# Patient Record
Sex: Male | Born: 1990 | Race: White | Hispanic: No | Marital: Single | Smoking: Current every day smoker
Health system: Southern US, Community
[De-identification: ages and names within clinical notes are randomized; demographics above are authoritative.]

## PROBLEM LIST (undated history)

## (undated) HISTORY — PX: WISDOM TOOTH EXTRACTION: SHX21

---
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
## 2015-10-14 ENCOUNTER — Encounter: Payer: Self-pay | Admitting: Emergency Medicine

## 2015-10-14 ENCOUNTER — Emergency Department: Payer: Medicaid - Out of State

## 2015-10-14 DIAGNOSIS — J029 Acute pharyngitis, unspecified: Secondary | ICD-10-CM | POA: Insufficient documentation

## 2015-10-14 DIAGNOSIS — F17210 Nicotine dependence, cigarettes, uncomplicated: Secondary | ICD-10-CM | POA: Insufficient documentation

## 2015-10-14 DIAGNOSIS — R042 Hemoptysis: Secondary | ICD-10-CM | POA: Insufficient documentation

## 2015-10-14 DIAGNOSIS — R0602 Shortness of breath: Secondary | ICD-10-CM | POA: Diagnosis present

## 2015-10-14 LAB — CBC
HCT: 43.1 % (ref 40.0–52.0)
HEMOGLOBIN: 14.9 g/dL (ref 13.0–18.0)
MCH: 29.5 pg (ref 26.0–34.0)
MCHC: 34.6 g/dL (ref 32.0–36.0)
MCV: 85.4 fL (ref 80.0–100.0)
PLATELETS: 192 10*3/uL (ref 150–440)
RBC: 5.04 MIL/uL (ref 4.40–5.90)
RDW: 13.8 % (ref 11.5–14.5)
WBC: 13.7 10*3/uL — ABNORMAL HIGH (ref 3.8–10.6)

## 2015-10-14 LAB — BASIC METABOLIC PANEL
Anion gap: 7 (ref 5–15)
BUN: <5 mg/dL — ABNORMAL LOW (ref 6–20)
CALCIUM: 9.1 mg/dL (ref 8.9–10.3)
CO2: 28 mmol/L (ref 22–32)
CREATININE: 0.85 mg/dL (ref 0.61–1.24)
Chloride: 101 mmol/L (ref 101–111)
GFR calc Af Amer: >60 mL/min (ref >60)
GLUCOSE: 126 mg/dL — AB (ref 65–99)
Potassium: 3.4 mmol/L — ABNORMAL LOW (ref 3.5–5.1)
Sodium: 136 mmol/L (ref 135–145)

## 2015-10-14 LAB — TROPONIN I: Troponin I: <0.03 ng/mL (ref <0.03)

## 2015-10-14 NOTE — ED Notes (Signed)
Patient reports that he started with shortness of breath and coughing yesterday. Patient reports some streaks of bright red blood with coughing. Patient reports that today he developed central chest pressure. Patient reports that he has also started having difficulty swallowing that started today.

## 2015-10-15 ENCOUNTER — Emergency Department
Admission: EM | Admit: 2015-10-15 | Discharge: 2015-10-15 | Disposition: A | Discharge status: Home / Self Care | Payer: Medicaid - Out of State | Attending: Emergency Medicine | Admitting: Emergency Medicine

## 2015-10-15 MED ORDER — DEXAMETHASONE SODIUM PHOSPHATE 10 MG/ML IJ SOLN
INTRAMUSCULAR | Status: AC
  Filled 2015-10-15: qty 1

## 2015-10-15 MED ORDER — GI COCKTAIL ~~LOC~~
ORAL | Status: AC
  Filled 2015-10-15: qty 30

## 2019-05-27 ENCOUNTER — Other Ambulatory Visit: Payer: Self-pay

## 2019-05-27 ENCOUNTER — Ambulatory Visit: Payer: Medicaid - Out of State | Admitting: Podiatry

## 2019-05-27 DIAGNOSIS — L600 Ingrowing nail: Secondary | ICD-10-CM | POA: Diagnosis not present

## 2019-05-27 DIAGNOSIS — L603 Nail dystrophy: Secondary | ICD-10-CM

## 2019-05-28 ENCOUNTER — Encounter: Payer: Self-pay | Admitting: Podiatry

## 2019-05-28 NOTE — Progress Notes (Signed)
Subjective:  Patient ID: Hunter Kelly, male    DOB: 1991-02-23,  MRN: [id number]  Chief Complaint  Patient presents with  . Ingrown Toenail    bil ingrown toenails, (worse in the left) left big toenail lateral side, and right big toenail medial side, pt states that it has been going on for multiple years, pain is elevated when applying pressure.     29 y.o. male presents with the above complaint.  Patient presents with dystrophic toenail of the left as well as the right hallux bilaterally.  Patient states that they have gotten worse over time.  Patient has had previous toenail removed with removal at urgent care center which has caused him a lot of pain.  Patient states this has been going for 12 years.  He used to play basketball football that it caused him excessive pressure and has constantly hit it while playing sports.  He states that it is painful to touch.  The right side is bearable however the left side is very painful and he would like to know if there is anything that could be done for this.  I extensively discussed with the patient all treatment options that are associated with that including removing the entire toenail.  Patient is also very anxious and does not want his son that was present with him to see the blood.  Given that he has had plenty of negative experiences in the past with toenail of removal and the pain associated with it he is very hesitant to have it removed.   Review of Systems: Negative except as noted in the HPI. Denies N/V/F/Ch.  No past medical history on file.  Current Outpatient Medications:  .  Buprenorphine HCl-Naloxone HCl (SUBOXONE) 12-3 MG FILM, Place under the tongue., Disp: , Rfl:  .  hydrOXYzine (ATARAX/VISTARIL) 10 MG tablet, Take 10 mg by mouth 3 (three) times daily as needed., Disp: , Rfl:   Social History   Tobacco Use  Smoking Status Current Every Day Smoker  . Packs/day: 1.00  . Types: Cigarettes    No Known Allergies Objective:    There were no vitals filed for this visit. There is no height or weight on file to calculate BMI. Constitutional Well developed. Well nourished.  Vascular Dorsalis pedis pulses palpable bilaterally. Posterior tibial pulses palpable bilaterally. Capillary refill normal to all digits.  No cyanosis or clubbing noted. Pedal hair growth normal.  Neurologic Normal speech. Oriented to person, place, and time. Epicritic sensation to light touch grossly present bilaterally.  Dermatologic Pain on palpation of the entire/total nail on 1st digit of the bilaterally No other open wounds. No skin lesions.  Orthopedic: Normal joint ROM without pain or crepitus bilaterally. No visible deformities. No bony tenderness.   Radiographs: None Assessment:   1. Ingrown left big toenail   2. Ingrown toenail of right foot   3. Nail dystrophy    Plan:  Patient was evaluated and treated and all questions answered.  Nail contusion/dystrophy hallux, bilaterally left greater than right -I extensively discussed with the patient all the various procedures that are involved including and up to total nail avulsion with matricectomy.  Patient is very anxious and afraid to have the toenails removed at this time.  He is also here with his son and he does not want the patient son to see the blood when undergoing the procedure.  He would like to know if this could be done at a later time.  I agree with the plan  and patient will be scheduled next week on his day off for left hallux total nail avulsion.  If his right hallux becomes more symptomatic we will plan on removing that as well. -Given his history of traumatic injury to both of the hallux this is likely due to trauma and less likely due to fungus.  This was also very extensively discussed between differences between the 2.  I did explain to the patient the only way to evaluate and differentiate between the 2 is through biopsying the nail.  Even then this could come  back as both. -I spent greater than 50 minutes with greater than 50% face-to-face encounter with the patient extensively discussing various treatment options including performing culture of the nail biopsying the nail as well as discussing will be the best options for him given that he has had multiple removals done in the past and the nail keeps growing back in a very dystrophic way.  Given the amount of pain in the painful experiences is had in past is also very hesitant to have it removed because he is worried that this will cause him a lot of pain.  I did explain to the patient that I tend to use short acting and long-acting numbing agent to decrease the pain associated with it.  Patient states understanding.  We will plan on performing this during the next visit next week.  Return f/u next thursday, overbook if needed.

## 2019-06-03 ENCOUNTER — Other Ambulatory Visit: Payer: Self-pay

## 2019-06-03 ENCOUNTER — Ambulatory Visit: Payer: Medicaid - Out of State | Admitting: Podiatry

## 2019-06-03 DIAGNOSIS — B351 Tinea unguium: Secondary | ICD-10-CM | POA: Diagnosis not present

## 2019-06-03 DIAGNOSIS — L603 Nail dystrophy: Secondary | ICD-10-CM

## 2019-06-03 DIAGNOSIS — L600 Ingrowing nail: Secondary | ICD-10-CM | POA: Diagnosis not present

## 2019-06-03 MED ORDER — TERBINAFINE HCL 250 MG PO TABS: 250.0000 mg | ORAL_TABLET | Freq: Every day | ORAL | 0 refills | Status: DC

## 2019-06-03 MED ORDER — DOXYCYCLINE HYCLATE 100 MG PO TABS: 100.0000 mg | ORAL_TABLET | Freq: Two times a day (BID) | ORAL | 0 refills | Status: DC

## 2019-06-04 ENCOUNTER — Encounter: Payer: Self-pay | Admitting: Podiatry

## 2019-06-04 NOTE — Progress Notes (Signed)
Subjective:  Patient ID: Hunter Kelly, male    DOB: 08/21/1990,  MRN: [id number]  Chief Complaint  Patient presents with  . Ingrown Toenail    pt is here for an ingrown toenail f/u, pt states that the left big toenail is still in pain since the last time he was here, pt states that he also has his liver function test that needs to be reviewed.    29 y.o. male presents with the above complaint.  Patient is following up today for left big toenail total nail avulsion.  Given how dystrophic the nail [place] I believe patient will benefit from that with the sample of the nail being sent to pathology to be evaluated if this is traumatic versus fungal involvement.  Patient also has a liver function test that was done which was normal.  I will start him on Lamisil therapy.   Review of Systems: Negative except as noted in the HPI. Denies N/V/F/Ch.  No past medical history on file.  Current Outpatient Medications:  .  Buprenorphine HCl-Naloxone HCl (SUBOXONE) 12-3 MG FILM, Place under the tongue., Disp: , Rfl:  .  hydrOXYzine (ATARAX/VISTARIL) 10 MG tablet, Take 10 mg by mouth 3 (three) times daily as needed., Disp: , Rfl:  .  doxycycline (VIBRA-TABS) 100 MG tablet, Take 1 tablet (100 mg total) by mouth 2 (two) times daily., Disp: 20 tablet, Rfl: 0 .  terbinafine (LAMISIL) 250 MG tablet, Take 1 tablet (250 mg total) by mouth daily., Disp: 90 tablet, Rfl: 0  Social History   Tobacco Use  Smoking Status Current Every Day Smoker  . Packs/day: 1.00  . Types: Cigarettes    No Known Allergies Objective:  There were no vitals filed for this visit. There is no height or weight on file to calculate BMI. Constitutional Well developed. Well nourished.  Vascular Dorsalis pedis pulses palpable bilaterally. Posterior tibial pulses palpable bilaterally. Capillary refill normal to all digits.  No cyanosis or clubbing noted. Pedal hair growth normal.  Neurologic Normal speech. Oriented to person, place,  and time. Epicritic sensation to light touch grossly present bilaterally.  Dermatologic Pain on palpation of the entire/total nail on 1st digit of the bilaterally No other open wounds. No skin lesions.  Orthopedic: Normal joint ROM without pain or crepitus bilaterally. No visible deformities. No bony tenderness.   Radiographs: None Assessment:   1. Nail fungus   2. Nail dystrophy   3. Onychomycosis due to dermatophyte   4. Ingrown left big toenail   5. Ingrown toenail of right foot    Plan:  Patient was evaluated and treated and all questions answered.  Nail contusion/dystrophy left hallux, left -Patient elects to proceed with minor surgery to remove entire toenail today. Consent reviewed and signed by patient. -Entire/total nail excised. See procedure note. -Educated on post-procedure care including soaking. Written instructions provided and reviewed. -Patient to follow up in 2 weeks for nail check. -The nail was sent to be evaluated for pathology for involvement of fungus versus traumatic nature to it. -The labs were reviewed the patient brought in and will be scanned into the system.  The liver function test was normal and therefore I feel comfortable starting the patient on Lamisil. -I have asked him to complete the course for 3 months and I will see him back on the fourth month for evaluation of the new nail. -If there is any issues or any clinical signs of infection I have asked the patient to go to the emergency  room or come see me right away.  Patient states understanding -I have also placed him on doxycycline prophylactics for 10 days for skin and soft tissue prophylaxis -I will call him back once the results from pathologist is back and discussed the findings with him.   Procedure: Excision of entire/total nail  Location: Left 1st toe digit Anesthesia: Lidocaine 1% plain; 1.5 mL and Marcaine 0.5% plain; 1.5 mL, digital block. Skin Prep: Betadine. Dressing: Silvadene;  telfa; dry, sterile, compression dressing. Technique: Following skin prep, the toe was exsanguinated and a tourniquet was secured at the base of the toe. The affected nail border was freed and excised. The tourniquet was then removed and sterile dressing applied.  Phenol was applied to the medial and lateral corner of the nail in an attempt to make it permanent therefore no ingrown is present. Disposition: Patient tolerated procedure well. Patient to return in 2 weeks for follow-up.   No follow-ups on file.   No follow-ups on file.

## 2019-06-18 ENCOUNTER — Encounter (HOSPITAL_COMMUNITY): Payer: Self-pay | Admitting: Cardiology

## 2019-06-18 ENCOUNTER — Other Ambulatory Visit: Payer: Self-pay

## 2019-06-18 ENCOUNTER — Ambulatory Visit (INDEPENDENT_AMBULATORY_CARE_PROVIDER_SITE_OTHER): Payer: Medicaid Other | Admitting: Podiatry

## 2019-06-18 ENCOUNTER — Ambulatory Visit (INDEPENDENT_AMBULATORY_CARE_PROVIDER_SITE_OTHER): Payer: Medicaid Other

## 2019-06-18 DIAGNOSIS — L03116 Cellulitis of left lower limb: Secondary | ICD-10-CM | POA: Diagnosis not present

## 2019-06-18 MED ORDER — CLINDAMYCIN HCL 300 MG PO CAPS: 300.0000 mg | ORAL_CAPSULE | Freq: Three times a day (TID) | ORAL | 0 refills | Status: DC

## 2019-06-18 NOTE — Progress Notes (Signed)
Attempt made to contact Dr. Logan Bores with preliminary report for findings of negative bilateral LEV DVT study at 5:48 and answering service at 5:55PM.

## 2019-06-23 NOTE — Progress Notes (Signed)
   HPI: 29 y.o. male presenting today as a walk-in after hours.  Apparently there was some confusion whether to present to the [place] office or [place] office.  He presented to the [place] office after the office hours were closed.  We decided to add him onto the daily schedule for evaluation.  He presented in severe pain to his left foot.  Pain extends up to the calf.  He had total nail avulsion performed to the left hallux by Dr. Allena Katz on 05/27/2019.  He states that ever since the procedure he has had severe pain throughout the entire foot.  He is a smoker and smokes 1 pack/day.  No past medical history on file.   Physical Exam: General: The patient is alert and oriented x3 in no acute distress.  Dermatology: Nail avulsion left hallux.  Appears to be healing well.  There is no erythema or edema around the toe.  No drainage.  No malodor noted.  Vascular: Palpable pedal pulses bilaterally.  There is some mild edema of the ankle.  There is no erythema or edema to the nail avulsion site of the left hallux.  Capillary refill within normal limits.  Neurological: Epicritic and protective threshold grossly intact bilaterally.   Musculoskeletal Exam: Range of motion within normal limits to all pedal and ankle joints bilateral. Muscle strength 5/5 in all groups bilateral.  Severe pain on palpation noted to the general area of the foot extending up the ankle into the back of the leg   Assessment: 1.  Status post total nail avulsion left hallux with routine healing 2.  Severe pain left foot and posterior leg concerning for possible DVT 3.  Mild edema left ankle   Plan of Care:  1. Patient evaluated. 2.  Continue antibiotic cream and a Band-Aid to the left hallux 3.  Today we will order a stat venous Doppler left lower extremity to rule out DVT 4.  Prescription for doxycycline 100 mg 2 times daily #20 5.  Return to clinic in 1 week with Dr. Boyd Kerbs, DPM [facility]  Dr. Felecia Shelling, DPM    [address] [place], [place] [address]                Office [phone]  Fax [phone]

## 2020-03-07 ENCOUNTER — Other Ambulatory Visit: Payer: Self-pay

## 2020-03-07 ENCOUNTER — Emergency Department: Payer: Medicaid Other

## 2020-03-07 ENCOUNTER — Encounter: Payer: Self-pay | Admitting: Emergency Medicine

## 2020-03-07 ENCOUNTER — Inpatient Hospital Stay
Admission: EM | Admit: 2020-03-07 | Discharge: 2020-03-09 | DRG: 202 | Disposition: A | Discharge status: Home / Self Care | Payer: Medicaid Other | Attending: Internal Medicine | Admitting: Internal Medicine

## 2020-03-07 DIAGNOSIS — J9601 Acute respiratory failure with hypoxia: Secondary | ICD-10-CM | POA: Diagnosis present

## 2020-03-07 DIAGNOSIS — J9690 Respiratory failure, unspecified, unspecified whether with hypoxia or hypercapnia: Secondary | ICD-10-CM | POA: Diagnosis present

## 2020-03-07 DIAGNOSIS — F17210 Nicotine dependence, cigarettes, uncomplicated: Secondary | ICD-10-CM | POA: Diagnosis present

## 2020-03-07 DIAGNOSIS — Z825 Family history of asthma and other chronic lower respiratory diseases: Secondary | ICD-10-CM

## 2020-03-07 DIAGNOSIS — R739 Hyperglycemia, unspecified: Secondary | ICD-10-CM | POA: Diagnosis not present

## 2020-03-07 DIAGNOSIS — J40 Bronchitis, not specified as acute or chronic: Principal | ICD-10-CM | POA: Diagnosis present

## 2020-03-07 DIAGNOSIS — R0600 Dyspnea, unspecified: Secondary | ICD-10-CM

## 2020-03-07 DIAGNOSIS — Z20822 Contact with and (suspected) exposure to covid-19: Secondary | ICD-10-CM | POA: Diagnosis present

## 2020-03-07 DIAGNOSIS — R062 Wheezing: Secondary | ICD-10-CM

## 2020-03-07 DIAGNOSIS — F1111 Opioid abuse, in remission: Secondary | ICD-10-CM | POA: Diagnosis present

## 2020-03-07 DIAGNOSIS — T380X5A Adverse effect of glucocorticoids and synthetic analogues, initial encounter: Secondary | ICD-10-CM | POA: Diagnosis not present

## 2020-03-07 LAB — CBC WITH DIFFERENTIAL/PLATELET
Abs Immature Granulocytes: 0.03 10*3/uL (ref 0.00–0.07)
Basophils Absolute: 0.1 10*3/uL (ref 0.0–0.1)
Basophils Relative: 1 %
Eosinophils Absolute: 0.9 10*3/uL — ABNORMAL HIGH (ref 0.0–0.5)
Eosinophils Relative: 8 %
HCT: 47.9 % (ref 39.0–52.0)
Hemoglobin: 15.9 g/dL (ref 13.0–17.0)
Immature Granulocytes: 0 %
Lymphocytes Relative: 30 %
Lymphs Abs: 3.2 10*3/uL (ref 0.7–4.0)
MCH: 29.1 pg (ref 26.0–34.0)
MCHC: 33.2 g/dL (ref 30.0–36.0)
MCV: 87.6 fL (ref 80.0–100.0)
Monocytes Absolute: 0.7 10*3/uL (ref 0.1–1.0)
Monocytes Relative: 6 %
Neutro Abs: 5.8 10*3/uL (ref 1.7–7.7)
Neutrophils Relative %: 55 %
Platelets: 309 10*3/uL (ref 150–400)
RBC: 5.47 MIL/uL (ref 4.22–5.81)
RDW: 13.2 % (ref 11.5–15.5)
WBC: 10.7 10*3/uL — ABNORMAL HIGH (ref 4.0–10.5)
nRBC: 0 % (ref 0.0–0.2)

## 2020-03-07 LAB — BASIC METABOLIC PANEL
Anion gap: 10 (ref 5–15)
BUN: 5 mg/dL — ABNORMAL LOW (ref 6–20)
CO2: 27 mmol/L (ref 22–32)
Calcium: 9.2 mg/dL (ref 8.9–10.3)
Chloride: 102 mmol/L (ref 98–111)
Creatinine, Ser: 0.66 mg/dL (ref 0.61–1.24)
GFR, Estimated: >60 mL/min (ref >60)
Glucose, Bld: 103 mg/dL — ABNORMAL HIGH (ref 70–99)
Potassium: 4 mmol/L (ref 3.5–5.1)
Sodium: 139 mmol/L (ref 135–145)

## 2020-03-07 LAB — RESP PANEL BY RT-PCR (FLU A&B, COVID) ARPGX2
Influenza A by PCR: NEGATIVE
Influenza B by PCR: NEGATIVE
SARS Coronavirus 2 by RT PCR: NEGATIVE

## 2020-03-07 LAB — FIBRIN DERIVATIVES D-DIMER (ARMC ONLY): Fibrin derivatives D-dimer (ARMC): 173.92 ng/mL (FEU) (ref 0.00–499.00)

## 2020-03-07 MED ORDER — ENOXAPARIN SODIUM 40 MG/0.4ML ~~LOC~~ SOLN
40.0000 mg | SUBCUTANEOUS | Status: DC
  Administered 2020-03-08: 40 mg via SUBCUTANEOUS
  Filled 2020-03-07 (×3): qty 0.4

## 2020-03-07 MED ORDER — ONDANSETRON HCL 4 MG/2ML IJ SOLN
4.0000 mg | Freq: Four times a day (QID) | INTRAMUSCULAR | Status: DC | PRN
  Administered 2020-03-08: 06:00:00 4 mg via INTRAVENOUS
  Filled 2020-03-07: qty 2

## 2020-03-07 MED ORDER — ACETAMINOPHEN 650 MG RE SUPP: 650.0000 mg | Freq: Four times a day (QID) | RECTAL | Status: DC | PRN

## 2020-03-07 MED ORDER — IPRATROPIUM-ALBUTEROL 0.5-2.5 (3) MG/3ML IN SOLN
3.0000 mL | Freq: Once | RESPIRATORY_TRACT | Status: AC
  Administered 2020-03-07: 3 mL via RESPIRATORY_TRACT
  Filled 2020-03-07: qty 3

## 2020-03-07 MED ORDER — ONDANSETRON HCL 4 MG PO TABS: 4.0000 mg | ORAL_TABLET | Freq: Four times a day (QID) | ORAL | Status: DC | PRN

## 2020-03-07 MED ORDER — ACETAMINOPHEN 325 MG PO TABS
650.0000 mg | ORAL_TABLET | Freq: Four times a day (QID) | ORAL | Status: DC | PRN
  Administered 2020-03-08: 650 mg via ORAL
  Filled 2020-03-07: qty 2

## 2020-03-07 MED ORDER — GUAIFENESIN ER 600 MG PO TB12
600.0000 mg | ORAL_TABLET | Freq: Two times a day (BID) | ORAL | Status: DC
  Administered 2020-03-07 – 2020-03-09 (×4): 600 mg via ORAL
  Filled 2020-03-07 (×4): qty 1

## 2020-03-07 MED ORDER — METHYLPREDNISOLONE SODIUM SUCC 125 MG IJ SOLR
125.0000 mg | INTRAMUSCULAR | Status: AC
  Administered 2020-03-07: 125 mg via INTRAVENOUS
  Filled 2020-03-07: qty 2

## 2020-03-07 MED ORDER — ALBUTEROL SULFATE (2.5 MG/3ML) 0.083% IN NEBU
2.5000 mg | INHALATION_SOLUTION | RESPIRATORY_TRACT | Status: DC | PRN
  Administered 2020-03-07 – 2020-03-08 (×2): 2.5 mg via RESPIRATORY_TRACT
  Filled 2020-03-07 (×2): qty 3

## 2020-03-07 MED ORDER — ALBUTEROL SULFATE (2.5 MG/3ML) 0.083% IN NEBU
5.0000 mg | INHALATION_SOLUTION | Freq: Once | RESPIRATORY_TRACT | Status: AC
  Administered 2020-03-07: 5 mg via RESPIRATORY_TRACT
  Filled 2020-03-07: qty 6

## 2020-03-07 MED ORDER — METHYLPREDNISOLONE SODIUM SUCC 40 MG IJ SOLR
40.0000 mg | Freq: Four times a day (QID) | INTRAMUSCULAR | Status: DC
  Administered 2020-03-07 – 2020-03-09 (×7): 40 mg via INTRAVENOUS
  Filled 2020-03-07 (×8): qty 1

## 2020-03-07 MED ORDER — IPRATROPIUM-ALBUTEROL 0.5-2.5 (3) MG/3ML IN SOLN
3.0000 mL | Freq: Four times a day (QID) | RESPIRATORY_TRACT | Status: DC
  Administered 2020-03-07 – 2020-03-09 (×8): 3 mL via RESPIRATORY_TRACT
  Filled 2020-03-07 (×7): qty 3

## 2020-03-07 MED ORDER — SODIUM CHLORIDE 0.9 % IV SOLN: INTRAVENOUS | Status: DC

## 2020-03-07 MED ORDER — NICOTINE 21 MG/24HR TD PT24
21.0000 mg | MEDICATED_PATCH | Freq: Every day | TRANSDERMAL | Status: DC
  Administered 2020-03-07 – 2020-03-09 (×3): 21 mg via TRANSDERMAL
  Filled 2020-03-07 (×3): qty 1

## 2020-03-07 MED ORDER — MAGNESIUM SULFATE 2 GM/50ML IV SOLN
2.0000 g | INTRAVENOUS | Status: AC
  Administered 2020-03-07: 2 g via INTRAVENOUS
  Filled 2020-03-07: qty 50

## 2020-03-07 NOTE — H&P (Signed)
History and Physical   Hunter Kelly OZD:[id number] DOB: 11-16-1990 DOA: 03/07/2020  Referring MD/NP/PA: Dr. Scotty Court  [facility]P: [facility]   Outpatient Specialists: None  Patient coming from: Home  Chief Complaint: Shortness of breath  HPI: Hunter Kelly is a 29 y.o. male with medical history significant of no significant past medical history history who apparently had acute viral illness about 2 months ago.  At the time patient thought he may have had COVID-19 infection.  He went to see his primary care physician and requested for a test to evaluate.  He was told that he had asthma or COPD with exacerbation.  He was not tested and patient has been feeling bad.  Per patient he never got better since then he has been continuously having cough shortness of breath.  He was worried about post Covid syndrome and blood clots.  He has some nebulizer at home with albuterol at home that he has been using.  Patient came to the ER where he was evaluated.  No evidence of acute PE.  He seemed to have some mild expiratory wheezing but also has hypoxemia.  His oxygen saturation on room air was 86% currently started on 2 L of oxygen.  He is being admitted with acute hypoxemia probably secondary to acute respiratory failure from acute asthma or COPD.Marland Kitchen  ED Course: Temperature 98 blood pressure 113/70 pulse 170 respirate 20 oxygen sat 86% on room air.  White count 10.7 hemoglobin 14.6 platelets 281.  Sodium 139 potassium 4.0 chloride 102 CO2 27 glucose 103 BUN 5 creatinine 0.66.  D-dimer was 73 glucose 103.  Respiratory panel including COVID-19 negative.  Chest x-ray showed no acute findings.  Patient being admitted for observation and treatment of acute hypoxic respiratory failure.  Review of Systems: As per HPI otherwise 10 point review of systems negative.    History reviewed. No pertinent past medical history.  Past Surgical History:  Procedure Laterality Date  . WISDOM TOOTH EXTRACTION        reports that he has been smoking cigarettes. He has been smoking about 1.00 pack per day. He has never used smokeless tobacco. He reports that he does not drink alcohol and does not use drugs.  No Known Allergies  No family history on file.   Prior to Admission medications   Medication Sig Start Date End Date Taking? Authorizing Provider  Buprenorphine HCl-Naloxone HCl (SUBOXONE) 12-3 MG FILM Place under the tongue.    [provider]  clindamycin (CLEOCIN) 300 MG capsule Take 1 capsule (300 mg total) by mouth 3 (three) times daily. 06/18/19   Felecia Shelling, DPM  doxycycline (VIBRA-TABS) 100 MG tablet Take 1 tablet (100 mg total) by mouth 2 (two) times daily. 06/03/19   Candelaria Stagers, DPM  hydrOXYzine (ATARAX/VISTARIL) 10 MG tablet Take 10 mg by mouth 3 (three) times daily as needed.    [provider]  terbinafine (LAMISIL) 250 MG tablet Take 1 tablet (250 mg total) by mouth daily. 06/03/19   Candelaria Stagers, DPM    Physical Exam: Vitals:   03/07/20 1323 03/07/20 1324 03/07/20 1603 03/07/20 1854  BP:  122/81 118/80   Pulse:  (!) 117 80   Resp:  20 20   Temp:  98 F (36.7 C)    TempSrc:  Oral    SpO2:  97% 95% (!) 86%  Weight: 46.3 kg     Height: 5\' 7"  (1.702 m)  Constitutional: Acutely ill looking, no distress Vitals:   03/07/20 1323 03/07/20 1324 03/07/20 1603 03/07/20 1854  BP:  122/81 118/80   Pulse:  (!) 117 80   Resp:  20 20   Temp:  98 F (36.7 C)    TempSrc:  Oral    SpO2:  97% 95% (!) 86%  Weight: 46.3 kg     Height: 5\' 7"  (1.702 m)      Eyes: PERRL, lids and conjunctivae normal ENMT: Mucous membranes are moist. Posterior pharynx clear of any exudate or lesions.Normal dentition.  Neck: normal, supple, no masses, no thyromegaly Respiratory: Decreased air entry bilaterally with mild expiratory wheeze, normal respiratory effort. No accessory muscle use.  Cardiovascular: Sinus tachycardia, no murmurs / rubs / gallops. No extremity  edema. 2+ pedal pulses. No carotid bruits.  Abdomen: no tenderness, no masses palpated. No hepatosplenomegaly. Bowel sounds positive.  Musculoskeletal: no clubbing / cyanosis. No joint deformity upper and lower extremities. Good ROM, no contractures. Normal muscle tone.  Skin: no rashes, lesions, ulcers. No induration Neurologic: CN 2-12 grossly intact. Sensation intact, DTR normal. Strength 5/5 in all 4.  Psychiatric: Normal judgment and insight. Alert and oriented x 3. Normal mood.     Labs on Admission: I have personally reviewed following labs and imaging studies  CBC: Recent Labs  Lab 03/07/20 1638  WBC 10.7*  NEUTROABS 5.8  HGB 15.9  HCT 47.9  MCV 87.6  PLT 309   Basic Metabolic Panel: Recent Labs  Lab 03/07/20 1638  NA 139  K 4.0  CL 102  CO2 27  GLUCOSE 103*  BUN 5*  CREATININE 0.66  CALCIUM 9.2   GFR: Estimated Creatinine Clearance: 90.8 mL/min (by C-G formula based on SCr of 0.66 mg/dL). Liver Function Tests: No results for input(s): AST, ALT, ALKPHOS, BILITOT, PROT, ALBUMIN in the last 168 hours. No results for input(s): LIPASE, AMYLASE in the last 168 hours. No results for input(s): AMMONIA in the last 168 hours. Coagulation Profile: No results for input(s): INR, PROTIME in the last 168 hours. Cardiac Enzymes: No results for input(s): CKTOTAL, CKMB, CKMBINDEX, TROPONINI in the last 168 hours. BNP (last 3 results) No results for input(s): PROBNP in the last 8760 hours. HbA1C: No results for input(s): HGBA1C in the last 72 hours. CBG: No results for input(s): GLUCAP in the last 168 hours. Lipid Profile: No results for input(s): CHOL, HDL, LDLCALC, TRIG, CHOLHDL, LDLDIRECT in the last 72 hours. Thyroid Function Tests: No results for input(s): TSH, T4TOTAL, FREET4, T3FREE, THYROIDAB in the last 72 hours. Anemia Panel: No results for input(s): VITAMINB12, FOLATE, FERRITIN, TIBC, IRON, RETICCTPCT in the last 72 hours. Urine analysis: No results found  for: COLORURINE, APPEARANCEUR, LABSPEC, PHURINE, GLUCOSEU, HGBUR, BILIRUBINUR, KETONESUR, PROTEINUR, UROBILINOGEN, NITRITE, LEUKOCYTESUR Sepsis Labs: @LABRCNTIP (procalcitonin:4,lacticidven:4) ) Recent Results (from the past 240 hour(s))  Resp Panel by RT-PCR (Flu A&B, Covid) Nasopharyngeal Swab     Status: None   Collection Time: 03/07/20  4:38 PM   Specimen: Nasopharyngeal Swab; Nasopharyngeal(NP) swabs in vial transport medium  Result Value Ref Range Status   SARS Coronavirus 2 by RT PCR NEGATIVE NEGATIVE Final    Comment: (NOTE) SARS-CoV-2 target nucleic acids are NOT DETECTED.  The SARS-CoV-2 RNA is generally detectable in upper respiratory specimens during the acute phase of infection. The lowest concentration of SARS-CoV-2 viral copies this assay can detect is 138 copies/mL. A negative result does not preclude SARS-Cov-2 infection and should not be used as the sole basis for treatment or  other patient management decisions. A negative result may occur with  improper specimen collection/handling, submission of specimen other than nasopharyngeal swab, presence of viral mutation(s) within the areas targeted by this assay, and inadequate number of viral copies(<138 copies/mL). A negative result must be combined with clinical observations, patient history, and epidemiological information. The expected result is Negative.  Fact Sheet for Patients:  BloggerCourse.com  Fact Sheet for Healthcare Providers:  SeriousBroker.it  This test is no t yet approved or cleared by the [place] FDA and  has been authorized for detection and/or diagnosis of SARS-CoV-2 by FDA under an Emergency Use Authorization (EUA). This EUA will remain  in effect (meaning this test can be used) for the duration of the COVID-19 declaration under Section 564(b)(1) of the Act, 21 U.S.C.section 360bbb-3(b)(1), unless the authorization is terminated  or revoked  sooner.       Influenza A by PCR NEGATIVE NEGATIVE Final   Influenza B by PCR NEGATIVE NEGATIVE Final    Comment: (NOTE) The Xpert Xpress SARS-CoV-2/FLU/RSV plus assay is intended as an aid in the diagnosis of influenza from Nasopharyngeal swab specimens and should not be used as a sole basis for treatment. Nasal washings and aspirates are unacceptable for Xpert Xpress SARS-CoV-2/FLU/RSV testing.  Fact Sheet for Patients: BloggerCourse.com  Fact Sheet for Healthcare Providers: SeriousBroker.it  This test is not yet approved or cleared by the [place] FDA and has been authorized for detection and/or diagnosis of SARS-CoV-2 by FDA under an Emergency Use Authorization (EUA). This EUA will remain in effect (meaning this test can be used) for the duration of the COVID-19 declaration under Section 564(b)(1) of the Act, 21 U.S.C. section 360bbb-3(b)(1), unless the authorization is terminated or revoked.  Performed at [facility], [address] [place], [place] [address]      Radiological Exams on Admission: DG Chest 2 View  Result Date: 03/07/2020 CLINICAL DATA:  Cough and shortness of breath EXAM: CHEST - 2 VIEW COMPARISON:  10/15/2015 FINDINGS: The heart size and mediastinal contours are within normal limits. Both lungs are clear. The visualized skeletal structures are unremarkable. IMPRESSION: No active cardiopulmonary disease. Electronically Signed   By: Signa Kell M.D.   On: 03/07/2020 14:06    EKG: Independently reviewed.  Sinus tachycardia otherwise no significant findings.  Assessment/Plan Active Problems:   Acute respiratory failure with hypoxia (HCC)     #1 acute respiratory failure with hypoxia: Suspected asthma or COPD.  Patient's mother has asthma but patient has never been diagnosed.  He has not been diagnosed formally for COPD as well.  We will admit the patient and initiate steroid nebulizer  with antibiotics.  We will consider long-term therapy and home oxygen.  #2 tobacco abuse: Nicotine patch with tobacco cessation counseling.   DVT prophylaxis: Lovenox Code Status: Full code Family Communication: No family at bedside Disposition Plan: Home Consults called: None Admission status: Observation  Severity of Illness: The appropriate patient status for this patient is OBSERVATION. Observation status is judged to be reasonable and necessary in order to provide the required intensity of service to ensure the patient's safety. The patient's presenting symptoms, physical exam findings, and initial radiographic and laboratory data in the context of their medical condition is felt to place them at decreased risk for further clinical deterioration. Furthermore, it is anticipated that the patient will be medically stable for discharge from the hospital within 2 midnights of admission. The following factors support the patient status of observation.   "  The patient's presenting symptoms include shortness of breath and cough. " The physical exam findings include mild expiratory wheezing. " The initial radiographic and laboratory data are no acute findings infection.     Lonia Blood MD [facility] Pager [phone]  If 7PM-7AM, please contact night-coverage [email or web address] Password [facility]  03/07/2020, 7:41 PM

## 2020-03-07 NOTE — ED Notes (Signed)
Patient is sleeping and is 86% on room air while sleeping. Dr. Scotty Court is aware.

## 2020-03-07 NOTE — ED Triage Notes (Addendum)
Arrives from PCP for ED evaluation .  Patient has a cough x 2 months that is not improving.  STates he believes symptoms started when he had COvid 2 months ago, but he was not tested by PCP, rather told he had new onset asthma and COPD.  Patient states he is unable to catch his breath.  Patient states o2 sats 82% on RA at PCP.    Strong cough in triage.

## 2020-03-07 NOTE — ED Provider Notes (Signed)
[facility] Emergency Department Provider Note  ____________________________________________  Time seen: Approximately 7:07 PM  I have reviewed the triage vital signs and the nursing notes.   HISTORY  Chief Complaint Cough    HPI Hunter Kelly is a 29 y.o. male with no significant past medical history who comes ED complaining of cough for the past 2 months associated with shortness of breath and wheezing, worse at night, requiring frequent use of bronchodilators with a home nebulizer and albuterol pump inhaler.  He has seen primary care who started him on the bronchodilators.  He is not taking any inhaled corticosteroids or leukotriene inhibitor.  Denies chest pain.  No recent travel trauma hospitalization surgery or history of DVT or PE.  Patient does report the symptoms started after having a viral respiratory illness about 2 months ago.  He was not tested for Covid at that time but believes that he had Covid and wonders if that may have precipitated new lung disease.  He is also a daily smoker.      History reviewed. No pertinent past medical history.   There are no problems to display for this patient.    Past Surgical History:  Procedure Laterality Date  . WISDOM TOOTH EXTRACTION       Prior to Admission medications   Medication Sig Start Date End Date Taking? Authorizing Provider  Buprenorphine HCl-Naloxone HCl (SUBOXONE) 12-3 MG FILM Place under the tongue.    [provider]  clindamycin (CLEOCIN) 300 MG capsule Take 1 capsule (300 mg total) by mouth 3 (three) times daily. 06/18/19   Felecia Shelling, DPM  doxycycline (VIBRA-TABS) 100 MG tablet Take 1 tablet (100 mg total) by mouth 2 (two) times daily. 06/03/19   Candelaria Stagers, DPM  hydrOXYzine (ATARAX/VISTARIL) 10 MG tablet Take 10 mg by mouth 3 (three) times daily as needed.    [provider]  terbinafine (LAMISIL) 250 MG tablet Take 1 tablet (250 mg total) by mouth daily.  06/03/19   Candelaria Stagers, DPM     Allergies Patient has no known allergies.   No family history on file.  Social History Social History   Tobacco Use  . Smoking status: Current Every Day Smoker    Packs/day: 1.00    Types: Cigarettes  . Smokeless tobacco: Never Used  Substance Use Topics  . Alcohol use: No  . Drug use: No    Review of Systems  Constitutional:   No fever or chills.  ENT:   No sore throat. No rhinorrhea. Cardiovascular:   No chest pain or syncope. Respiratory:   Positive shortness of breath and nonproductive cough. Gastrointestinal:   Negative for abdominal pain, vomiting and diarrhea.  Musculoskeletal:   Negative for focal pain or swelling All other systems reviewed and are negative except as documented above in ROS and HPI.  ____________________________________________   PHYSICAL EXAM:  VITAL SIGNS: ED Triage Vitals  Enc Vitals Group     BP 03/07/20 1324 122/81     Pulse Rate 03/07/20 1324 (!) 117     Resp 03/07/20 1324 20     Temp 03/07/20 1324 98 F (36.7 C)     Temp Source 03/07/20 1324 Oral     SpO2 03/07/20 1324 97 %     Weight 03/07/20 1323 102 lb 1.2 oz (46.3 kg)     Height 03/07/20 1323 5\' 7"  (1.702 m)     Head Circumference --      Peak Flow --  Pain Score 03/07/20 1323 0     Pain Loc --      Pain Edu? --      Excl. in GC? --     Vital signs reviewed, nursing assessments reviewed.   Constitutional:   Alert and oriented. Non-toxic appearance. Eyes:   Conjunctivae are normal. EOMI. PERRL. ENT      Head:   Normocephalic and atraumatic.      Nose:   Wearing a mask.      Mouth/Throat:   Wearing a mask.      Neck:   No meningismus. Full ROM. Hematological/Lymphatic/Immunilogical:   No cervical lymphadenopathy. Cardiovascular:   RRR. Symmetric bilateral radial and DP pulses.  No murmurs. Cap refill less than 2 seconds. Respiratory:   Oxygen saturation 87% on room air.  Normal respiratory effort without tachypnea/retractions.   Diffuse expiratory wheezing and prolonged expiratory phase.  No focal crackles.  Symmetric air movement in all lung fields.. Gastrointestinal:   Soft and nontender. Non distended. There is no CVA tenderness.  No rebound, rigidity, or guarding. Musculoskeletal:   Normal range of motion in all extremities. No joint effusions.  No lower extremity tenderness.  No edema. Neurologic:   Normal speech and language.  Motor grossly intact. No acute focal neurologic deficits are appreciated.  Skin:    Skin is warm, dry and intact. No rash noted.  No petechiae, purpura, or bullae.  ____________________________________________    LABS (pertinent positives/negatives) (all labs ordered are listed, but only abnormal results are displayed) Labs Reviewed  CBC WITH DIFFERENTIAL/PLATELET - Abnormal; Notable for the following components:      Result Value   WBC 10.7 (*)    Eosinophils Absolute 0.9 (*)    All other components within normal limits  BASIC METABOLIC PANEL - Abnormal; Notable for the following components:   Glucose, Bld 103 (*)    BUN 5 (*)    All other components within normal limits  RESP PANEL BY RT-PCR (FLU A&B, COVID) ARPGX2  FIBRIN DERIVATIVES D-DIMER ([facility] ONLY)   ____________________________________________   EKG  Interpreted by me Normal sinus rhythm rate of 94, axis and intervals.  Normal QRS ST segments and T waves.  ____________________________________________    RADIOLOGY  DG Chest 2 View  Result Date: 03/07/2020 CLINICAL DATA:  Cough and shortness of breath EXAM: CHEST - 2 VIEW COMPARISON:  10/15/2015 FINDINGS: The heart size and mediastinal contours are within normal limits. Both lungs are clear. The visualized skeletal structures are unremarkable. IMPRESSION: No active cardiopulmonary disease. Electronically Signed   By: Signa Kell M.D.   On: 03/07/2020 14:06     ____________________________________________   PROCEDURES Procedures  ____________________________________________  DIFFERENTIAL DIAGNOSIS   Reactive airway disease, pneumonia, pneumothorax, pulmonary embolism  CLINICAL IMPRESSION / ASSESSMENT AND PLAN / ED COURSE  Medications ordered in the ED: Medications  methylPREDNISolone sodium succinate (SOLU-MEDROL) 125 mg/2 mL injection 125 mg (125 mg Intravenous Given 03/07/20 1647)  magnesium sulfate IVPB 2 g 50 mL (0 g Intravenous Stopped 03/07/20 1816)  ipratropium-albuterol (DUONEB) 0.5-2.5 (3) MG/3ML nebulizer solution 3 mL (3 mLs Nebulization Given 03/07/20 1651)  albuterol (PROVENTIL) (2.5 MG/3ML) 0.083% nebulizer solution 5 mg (5 mg Nebulization Given 03/07/20 1651)  ipratropium-albuterol (DUONEB) 0.5-2.5 (3) MG/3ML nebulizer solution 3 mL (3 mLs Nebulization Given 03/07/20 1817)    Pertinent labs & imaging results that were available during my care of the patient were reviewed by me and considered in my medical decision making (see chart for details).  Hunter Kelly was evaluated in Emergency Department on 03/07/2020 for the symptoms described in the history of present illness. He was evaluated in the context of the global COVID-19 pandemic, which necessitated consideration that the patient might be at risk for infection with the SARS-CoV-2 virus that causes COVID-19. Institutional protocols and algorithms that pertain to the evaluation of patients at risk for COVID-19 are in a state of rapid change based on information released by regulatory bodies including the CDC and federal and state organizations. These policies and algorithms were followed during the patient's care in the ED.   Patient presents with persistent shortness of breath and cough, clinically wheezing consistent with a new onset of asthma or reactive airway disease possibly instigated by recent viral illness.  Labs today are reassuring with a normal D-dimer.   Covid and flu test is negative, chest x-ray image reviewed by me which is unremarkable without pneumothorax pneumonia or pleural effusion, radiology report agrees.  Patient given duo nebs and albuterol, Solu-Medrol and IV magnesium without relief of symptoms.  Still very wheezy.  Still hypoxic on room air to 86%.  Case discussed with hospitalist for further management.  No signs of infection.  Patient is not septic.      ____________________________________________   FINAL CLINICAL IMPRESSION(S) / ED DIAGNOSES    Final diagnoses:  Acute respiratory failure with hypoxia (HCC)  Wheezing without diagnosis of asthma     ED Discharge Orders    None      Portions of this note were generated with dragon dictation software. Dictation errors may occur despite best attempts at proofreading.   Sharman Cheek, MD 03/07/20 [phone]

## 2020-03-08 ENCOUNTER — Inpatient Hospital Stay: Payer: Medicaid Other

## 2020-03-08 DIAGNOSIS — J40 Bronchitis, not specified as acute or chronic: Secondary | ICD-10-CM | POA: Diagnosis not present

## 2020-03-08 DIAGNOSIS — R739 Hyperglycemia, unspecified: Secondary | ICD-10-CM | POA: Diagnosis not present

## 2020-03-08 DIAGNOSIS — J9601 Acute respiratory failure with hypoxia: Secondary | ICD-10-CM

## 2020-03-08 DIAGNOSIS — T380X5A Adverse effect of glucocorticoids and synthetic analogues, initial encounter: Secondary | ICD-10-CM | POA: Diagnosis not present

## 2020-03-08 DIAGNOSIS — Z825 Family history of asthma and other chronic lower respiratory diseases: Secondary | ICD-10-CM | POA: Diagnosis not present

## 2020-03-08 DIAGNOSIS — Z720 Tobacco use: Secondary | ICD-10-CM | POA: Diagnosis not present

## 2020-03-08 DIAGNOSIS — Z20822 Contact with and (suspected) exposure to covid-19: Secondary | ICD-10-CM | POA: Diagnosis present

## 2020-03-08 DIAGNOSIS — F1111 Opioid abuse, in remission: Secondary | ICD-10-CM | POA: Diagnosis present

## 2020-03-08 DIAGNOSIS — J9690 Respiratory failure, unspecified, unspecified whether with hypoxia or hypercapnia: Secondary | ICD-10-CM | POA: Diagnosis present

## 2020-03-08 DIAGNOSIS — R062 Wheezing: Secondary | ICD-10-CM | POA: Diagnosis present

## 2020-03-08 DIAGNOSIS — F17210 Nicotine dependence, cigarettes, uncomplicated: Secondary | ICD-10-CM | POA: Diagnosis present

## 2020-03-08 LAB — CBC
HCT: 43.8 % (ref 39.0–52.0)
HCT: 44.4 % (ref 39.0–52.0)
Hemoglobin: 14.6 g/dL (ref 13.0–17.0)
Hemoglobin: 15 g/dL (ref 13.0–17.0)
MCH: 28.5 pg (ref 26.0–34.0)
MCH: 29.5 pg (ref 26.0–34.0)
MCHC: 32.9 g/dL (ref 30.0–36.0)
MCHC: 34.2 g/dL (ref 30.0–36.0)
MCV: 86.2 fL (ref 80.0–100.0)
MCV: 86.7 fL (ref 80.0–100.0)
Platelets: 281 10*3/uL (ref 150–400)
Platelets: 290 10*3/uL (ref 150–400)
RBC: 5.08 MIL/uL (ref 4.22–5.81)
RBC: 5.12 MIL/uL (ref 4.22–5.81)
RDW: 13 % (ref 11.5–15.5)
RDW: 13 % (ref 11.5–15.5)
WBC: 10.7 10*3/uL — ABNORMAL HIGH (ref 4.0–10.5)
WBC: 9.6 10*3/uL (ref 4.0–10.5)
nRBC: 0 % (ref 0.0–0.2)
nRBC: 0 % (ref 0.0–0.2)

## 2020-03-08 LAB — COMPREHENSIVE METABOLIC PANEL
ALT: 10 U/L (ref 0–44)
AST: 22 U/L (ref 15–41)
Albumin: 4.2 g/dL (ref 3.5–5.0)
Alkaline Phosphatase: 70 U/L (ref 38–126)
Anion gap: 9 (ref 5–15)
BUN: 6 mg/dL (ref 6–20)
CO2: 24 mmol/L (ref 22–32)
Calcium: 9.1 mg/dL (ref 8.9–10.3)
Chloride: 103 mmol/L (ref 98–111)
Creatinine, Ser: 0.78 mg/dL (ref 0.61–1.24)
GFR, Estimated: >60 mL/min (ref >60)
Glucose, Bld: 208 mg/dL — ABNORMAL HIGH (ref 70–99)
Potassium: 3.8 mmol/L (ref 3.5–5.1)
Sodium: 136 mmol/L (ref 135–145)
Total Bilirubin: 0.6 mg/dL (ref 0.3–1.2)
Total Protein: 7.1 g/dL (ref 6.5–8.1)

## 2020-03-08 LAB — HEMOGLOBIN A1C
Hgb A1c MFr Bld: 5.7 % — ABNORMAL HIGH (ref 4.8–5.6)
Mean Plasma Glucose: 116.89 mg/dL

## 2020-03-08 LAB — CREATININE, SERUM
Creatinine, Ser: 0.75 mg/dL (ref 0.61–1.24)
GFR, Estimated: >60 mL/min (ref >60)

## 2020-03-08 LAB — HIV ANTIBODY (ROUTINE TESTING W REFLEX): HIV Screen 4th Generation wRfx: NONREACTIVE

## 2020-03-08 MED ORDER — BUPRENORPHINE HCL-NALOXONE HCL 2-0.5 MG SL SUBL
2.0000 | SUBLINGUAL_TABLET | Freq: Three times a day (TID) | SUBLINGUAL | Status: DC
  Administered 2020-03-08: 16:00:00 2 via SUBLINGUAL

## 2020-03-08 MED ORDER — HYDROCOD POLST-CPM POLST ER 10-8 MG/5ML PO SUER: 5.0000 mL | Freq: Two times a day (BID) | ORAL | Status: DC | PRN

## 2020-03-08 MED ORDER — METOPROLOL TARTRATE 5 MG/5ML IV SOLN: 5.0000 mg | Freq: Three times a day (TID) | INTRAVENOUS | Status: DC | PRN

## 2020-03-08 MED ORDER — BENZONATATE 100 MG PO CAPS
200.0000 mg | ORAL_CAPSULE | Freq: Three times a day (TID) | ORAL | Status: DC
  Administered 2020-03-08 – 2020-03-09 (×2): 200 mg via ORAL
  Filled 2020-03-08 (×2): qty 2

## 2020-03-08 MED ORDER — DIPHENHYDRAMINE HCL 50 MG/ML IJ SOLN
25.0000 mg | Freq: Once | INTRAMUSCULAR | Status: AC
  Administered 2020-03-08: 17:00:00 25 mg via INTRAVENOUS

## 2020-03-08 MED ORDER — BUPRENORPHINE HCL-NALOXONE HCL 8-2 MG SL FILM
1.0000 | ORAL_FILM | Freq: Every day | SUBLINGUAL | Status: DC
  Filled 2020-03-08: qty 1

## 2020-03-08 MED ORDER — BUPRENORPHINE HCL-NALOXONE HCL 8-2 MG SL FILM
1.0000 | ORAL_FILM | Freq: Three times a day (TID) | SUBLINGUAL | Status: DC
  Administered 2020-03-08 – 2020-03-09 (×2): 1 via SUBLINGUAL
  Filled 2020-03-08 (×6): qty 1

## 2020-03-08 MED ORDER — DIPHENHYDRAMINE HCL 50 MG/ML IJ SOLN
INTRAMUSCULAR | Status: AC
  Filled 2020-03-08: qty 1

## 2020-03-08 NOTE — ED Notes (Signed)
Pt c/o nausea, no emesis noted.  Also c/o mild abd. Cramping, zofran and tylenol given.

## 2020-03-08 NOTE — Progress Notes (Signed)
Pt is sleeping no s/s of distress hr is 90 and pt remains on RA 95% when pt awakens

## 2020-03-08 NOTE — Progress Notes (Signed)
Gave pt IS and verbalizes understanding on how to use it

## 2020-03-08 NOTE — Progress Notes (Addendum)
PROGRESS NOTE    Hunter Kelly  TIR:[id number] DOB: Aug 10, 1990 DOA: 03/07/2020 PCP: [facility]  Assessment & Plan:   Active Problems:   Acute respiratory failure with hypoxia (HCC)   Acute hypoxic respiratory failure: etiology unclear. CXR shows no active cardiopulmonary disease. COVID19, influenza are both neg. Continue on supplemental oxygen and wean as tolerated, on 2L [place]. Smokes daily. Encourage incentive spirometry & flutter valve. Continue on IV steroids and bronchodilators. Tessalon pearles prn for cough. Will need outpatient pulmonary function tests w/ pulmon.   Tobacco abuse: smoking cessation counseling. Nicotine patch to prevent w/drawal   Hyperglycemia: likely steroid induced, no hx of DM but will check HbA1c   Hx of narcotic abuse: continue on home dose of suboxone    DVT prophylaxis: lovenox  Code Status: full  Family Communication: Disposition Plan: likely d/c back home   Status is: Inpatient  Remains inpatient appropriate because:Ongoing diagnostic testing needed not appropriate for outpatient work up, IV treatments appropriate due to intensity of illness or inability to take PO and Inpatient level of care appropriate due to severity of illness   Dispo: The patient is from: Home              Anticipated d/c is to: Home              Anticipated d/c date is: 2 days              Patient currently is not medically stable to d/c.      Consultants:      Procedures:   Antimicrobials:   Subjective: Pt c/o shortness of breath   Objective: Vitals:   03/08/20 0536 03/08/20 0600 03/08/20 0700 03/08/20 0800  BP: 122/77 (!) 101/53 118/79 (!) 96/54  Pulse: 100 (!) 102 92 100  Resp: 14 15 12 12   Temp:    97.6 F (36.4 C)  TempSrc:    Oral  SpO2: 94% 94% 96% 93%  Weight:      Height:        Intake/Output Summary (Last 24 hours) at 03/08/2020 0850 Last data filed at 03/07/2020 2213 Gross per 24 hour  Intake 47.37 ml  Output  425 ml  Net -377.63 ml   Filed Weights   03/07/20 1323  Weight: 46.3 kg    Examination:  General exam: Appears calm but uncomfortable  Respiratory system: diminished breath sounds b/l. Wheezes b/l  Cardiovascular system: S1 & S2+. No  rubs, gallops or clicks. No pedal edema. Gastrointestinal system: Abdomen is nondistended, soft and nontender. Normal bowel sounds heard. Central nervous system: Alert and oriented. Moves all 4 extremities  Psychiatry: Judgement and insight appear normal. Mood & affect appropriate.     Data Reviewed: I have personally reviewed following labs and imaging studies  CBC: Recent Labs  Lab 03/07/20 1638 03/07/20 2342 03/08/20 0251  WBC 10.7* 10.7* 9.6  NEUTROABS 5.8  --   --   HGB 15.9 14.6 15.0  HCT 47.9 44.4 43.8  MCV 87.6 86.7 86.2  PLT 309 281 290   Basic Metabolic Panel: Recent Labs  Lab 03/07/20 1638 03/07/20 2342 03/08/20 0251  NA 139  --  136  K 4.0  --  3.8  CL 102  --  103  CO2 27  --  24  GLUCOSE 103*  --  208*  BUN 5*  --  6  CREATININE 0.66 0.75 0.78  CALCIUM 9.2  --  9.1   GFR: Estimated Creatinine Clearance: 90.8  mL/min (by C-G formula based on SCr of 0.78 mg/dL). Liver Function Tests: Recent Labs  Lab 03/08/20 0251  AST 22  ALT 10  ALKPHOS 70  BILITOT 0.6  PROT 7.1  ALBUMIN 4.2   No results for input(s): LIPASE, AMYLASE in the last 168 hours. No results for input(s): AMMONIA in the last 168 hours. Coagulation Profile: No results for input(s): INR, PROTIME in the last 168 hours. Cardiac Enzymes: No results for input(s): CKTOTAL, CKMB, CKMBINDEX, TROPONINI in the last 168 hours. BNP (last 3 results) No results for input(s): PROBNP in the last 8760 hours. HbA1C: No results for input(s): HGBA1C in the last 72 hours. CBG: No results for input(s): GLUCAP in the last 168 hours. Lipid Profile: No results for input(s): CHOL, HDL, LDLCALC, TRIG, CHOLHDL, LDLDIRECT in the last 72 hours. Thyroid Function  Tests: No results for input(s): TSH, T4TOTAL, FREET4, T3FREE, THYROIDAB in the last 72 hours. Anemia Panel: No results for input(s): VITAMINB12, FOLATE, FERRITIN, TIBC, IRON, RETICCTPCT in the last 72 hours. Sepsis Labs: No results for input(s): PROCALCITON, LATICACIDVEN in the last 168 hours.  Recent Results (from the past 240 hour(s))  Resp Panel by RT-PCR (Flu A&B, Covid) Nasopharyngeal Swab     Status: None   Collection Time: 03/07/20  4:38 PM   Specimen: Nasopharyngeal Swab; Nasopharyngeal(NP) swabs in vial transport medium  Result Value Ref Range Status   SARS Coronavirus 2 by RT PCR NEGATIVE NEGATIVE Final    Comment: (NOTE) SARS-CoV-2 target nucleic acids are NOT DETECTED.  The SARS-CoV-2 RNA is generally detectable in upper respiratory specimens during the acute phase of infection. The lowest concentration of SARS-CoV-2 viral copies this assay can detect is 138 copies/mL. A negative result does not preclude SARS-Cov-2 infection and should not be used as the sole basis for treatment or other patient management decisions. A negative result may occur with  improper specimen collection/handling, submission of specimen other than nasopharyngeal swab, presence of viral mutation(s) within the areas targeted by this assay, and inadequate number of viral copies(<138 copies/mL). A negative result must be combined with clinical observations, patient history, and epidemiological information. The expected result is Negative.  Fact Sheet for Patients:  BloggerCourse.com  Fact Sheet for Healthcare Providers:  SeriousBroker.it  This test is no t yet approved or cleared by the [place] FDA and  has been authorized for detection and/or diagnosis of SARS-CoV-2 by FDA under an Emergency Use Authorization (EUA). This EUA will remain  in effect (meaning this test can be used) for the duration of the COVID-19 declaration under Section  564(b)(1) of the Act, 21 U.S.C.section 360bbb-3(b)(1), unless the authorization is terminated  or revoked sooner.       Influenza A by PCR NEGATIVE NEGATIVE Final   Influenza B by PCR NEGATIVE NEGATIVE Final    Comment: (NOTE) The Xpert Xpress SARS-CoV-2/FLU/RSV plus assay is intended as an aid in the diagnosis of influenza from Nasopharyngeal swab specimens and should not be used as a sole basis for treatment. Nasal washings and aspirates are unacceptable for Xpert Xpress SARS-CoV-2/FLU/RSV testing.  Fact Sheet for Patients: BloggerCourse.com  Fact Sheet for Healthcare Providers: SeriousBroker.it  This test is not yet approved or cleared by the [place] FDA and has been authorized for detection and/or diagnosis of SARS-CoV-2 by FDA under an Emergency Use Authorization (EUA). This EUA will remain in effect (meaning this test can be used) for the duration of the COVID-19 declaration under Section 564(b)(1) of the Act, 21 U.S.C. section  360bbb-3(b)(1), unless the authorization is terminated or revoked.  Performed at [facility], [address] [place], [place] [address]          Radiology Studies: DG Chest 2 View  Result Date: 03/07/2020 CLINICAL DATA:  Cough and shortness of breath EXAM: CHEST - 2 VIEW COMPARISON:  10/15/2015 FINDINGS: The heart size and mediastinal contours are within normal limits. Both lungs are clear. The visualized skeletal structures are unremarkable. IMPRESSION: No active cardiopulmonary disease. Electronically Signed   By: Signa Kell M.D.   On: 03/07/2020 14:06        Scheduled Meds: . enoxaparin (LOVENOX) injection  40 mg Subcutaneous Q24H  . guaiFENesin  600 mg Oral BID  . ipratropium-albuterol  3 mL Nebulization Q6H  . methylPREDNISolone (SOLU-MEDROL) injection  40 mg Intravenous Q6H  . nicotine  21 mg Transdermal Daily   Continuous Infusions: . sodium chloride 100  mL/hr at 03/07/20 2210     LOS: 0 days    Time spent: 35 mins     Charise Killian, MD [facility] Pager [phone]  If 7PM-7AM, please contact night-coverage 03/08/2020, 8:50 AM

## 2020-03-08 NOTE — Progress Notes (Signed)
Was called to the room after giving pt SL suboxone and tessalon perles pt states his mouth was numb and pt started to slur his words pt states "it feels like my throat is closing" page to Dr. Mayford Knife new orders followed. Pt vs o2 was 97 to 99% during this time. Benadryl was given and within a few minutes patients speech became more normal while his throat remained feeling numb. Pt assisted to BR and ambulated on RA tolerated well o2 remained above 96% hr got up to 137 otherwise VSS, pt is now resting in bed. Will continue to monitor.

## 2020-03-08 NOTE — Progress Notes (Signed)
Pt arrived to unit in apparent stable condition via wc, once pt settled into bed asking for next breathing tx call to resp

## 2020-03-09 LAB — CBC
HCT: 46.3 % (ref 39.0–52.0)
Hemoglobin: 14.7 g/dL (ref 13.0–17.0)
MCH: 28.7 pg (ref 26.0–34.0)
MCHC: 31.7 g/dL (ref 30.0–36.0)
MCV: 90.3 fL (ref 80.0–100.0)
Platelets: 326 10*3/uL (ref 150–400)
RBC: 5.13 MIL/uL (ref 4.22–5.81)
RDW: 13.5 % (ref 11.5–15.5)
WBC: 18.4 10*3/uL — ABNORMAL HIGH (ref 4.0–10.5)
nRBC: 0 % (ref 0.0–0.2)

## 2020-03-09 LAB — URINE DRUG SCREEN, QUALITATIVE (ARMC ONLY)
Amphetamines, Ur Screen: NOT DETECTED
Barbiturates, Ur Screen: NOT DETECTED
Benzodiazepine, Ur Scrn: NOT DETECTED
Cannabinoid 50 Ng, Ur ~~LOC~~: NOT DETECTED
Cocaine Metabolite,Ur ~~LOC~~: NOT DETECTED
MDMA (Ecstasy)Ur Screen: NOT DETECTED
Methadone Scn, Ur: NOT DETECTED
Opiate, Ur Screen: NOT DETECTED
Phencyclidine (PCP) Ur S: NOT DETECTED
Tricyclic, Ur Screen: NOT DETECTED

## 2020-03-09 LAB — BASIC METABOLIC PANEL
Anion gap: 9 (ref 5–15)
BUN: 10 mg/dL (ref 6–20)
CO2: 26 mmol/L (ref 22–32)
Calcium: 9.3 mg/dL (ref 8.9–10.3)
Chloride: 106 mmol/L (ref 98–111)
Creatinine, Ser: 0.6 mg/dL — ABNORMAL LOW (ref 0.61–1.24)
GFR, Estimated: >60 mL/min (ref >60)
Glucose, Bld: 124 mg/dL — ABNORMAL HIGH (ref 70–99)
Potassium: 5 mmol/L (ref 3.5–5.1)
Sodium: 141 mmol/L (ref 135–145)

## 2020-03-09 MED ORDER — PREDNISONE 20 MG PO TABS: 40.0000 mg | ORAL_TABLET | Freq: Every day | ORAL | 0 refills | Status: AC

## 2020-03-09 MED ORDER — BENZONATATE 200 MG PO CAPS: 200.0000 mg | ORAL_CAPSULE | Freq: Three times a day (TID) | ORAL | 0 refills | Status: AC

## 2020-03-09 MED ORDER — DIPHENHYDRAMINE HCL 50 MG/ML IJ SOLN: 25.0000 mg | Freq: Four times a day (QID) | INTRAMUSCULAR | Status: DC | PRN

## 2020-03-09 MED ORDER — NICOTINE 21 MG/24HR TD PT24: 21.0000 mg | MEDICATED_PATCH | Freq: Every day | TRANSDERMAL | 0 refills | Status: AC

## 2020-03-09 NOTE — Discharge Summary (Signed)
Physician Discharge Summary  Hunter Kelly ZOX:[id number] DOB: 01/22/1991 DOA: 03/07/2020  PCP: [facility]  Admit date: 03/07/2020 Discharge date: 03/09/2020  Admitted From: home Disposition:  home  Recommendations for Outpatient Follow-up:  1. Follow up with PCP in 1-2 weeks 2. F/u w/ pulmon, Dr. Karna ChristmasAleskerov in 2 weeks to get pulmonary function tests done  Home Health: no Equipment/Devices:  Discharge Condition: stable  CODE STATUS: full  Diet recommendation:  regular  Brief/Interim Summary: HPI was taken from Dr. Mikeal HawthorneGarba: Hunter SnellenMichael S Kelly is a 29 y.o. male with medical history significant of no significant past medical history history who apparently had acute viral illness about 2 months ago.  At the time patient thought he may have had COVID-19 infection.  He went to see his primary care physician and requested for a test to evaluate.  He was told that he had asthma or COPD with exacerbation.  He was not tested and patient has been feeling bad.  Per patient he never got better since then he has been continuously having cough shortness of breath.  He was worried about post Covid syndrome and blood clots.  He has some nebulizer at home with albuterol at home that he has been using.  Patient came to the ER where he was evaluated.  No evidence of acute PE.  He seemed to have some mild expiratory wheezing but also has hypoxemia.  His oxygen saturation on room air was 86% currently started on 2 L of oxygen.  He is being admitted with acute hypoxemia probably secondary to acute respiratory failure from acute asthma or COPD.Marland Kitchen.  ED Course: Temperature 98 blood pressure 113/70 pulse 170 respirate 20 oxygen sat 86% on room air.  White count 10.7 hemoglobin 14.6 platelets 281.  Sodium 139 potassium 4.0 chloride 102 CO2 27 glucose 103 BUN 5 creatinine 0.66.  D-dimer was 73 glucose 103.  Respiratory panel including COVID-19 negative.  Chest x-ray showed no acute findings.  Patient being  admitted for observation and treatment of acute hypoxic respiratory failure.  Hospital course from Dr. Wilfred LacyJ. Turon Kilmer 12/15-12/16/21: Pt presented w/ shortness of breath secondary to acute hypoxic respiratory failure of unknown etiology. Pt received IV steroids, bronchodilators, incentive spirometry & flutter valve. Influenza, COVID19 were both neg & CXR was neg for pneumonia. Pt was able to weaned off of supplemental oxygen prior to d/c. Repeat CXR shows mild peribronchial thickening which could be secondary to bronchitis vs smoking. Pt does smoke cigarettes daily but wants to quit smoking. Pt will be d/c home w/ nicotine patches. Of note, pt will f/u outpatient w/ pulmon, Dr. Karna ChristmasAleskerov for pulmonary function tests. Pt verbalized his understanding.   Discharge Diagnoses:  Active Problems:   Acute respiratory failure with hypoxia (HCC)   Respiratory failure (HCC)  Acute hypoxic respiratory failure: etiology unclear, possibly bronchitis. Repeat CXR shows mild peribronchial thickening. COVID19, influenza are both neg. Continue on supplemental oxygen and wean as tolerated, on 2L [place]. Smokes daily. Encourage incentive spirometry & flutter valve. Continue on IV steroids and bronchodilators. Tessalon pearles prn for cough. Will need outpatient pulmonary function tests w/ pulmon.   Tobacco abuse: smoking cessation counseling. Nicotine patch to prevent w/drawal   Hyperglycemia: likely steroid induced, no hx of DM but will check HbA1c   Hx of narcotic abuse: continue on home dose of suboxone   Discharge Instructions  Discharge Instructions    Diet general   Complete by: As directed    Discharge instructions   Complete by:  As directed    F/u w/ pulmon, Dr. Karna Christmas in 2 weeks to get pulmonary function tests. F/u PCP in 1-2 weeks   Increase activity slowly   Complete by: As directed      Allergies as of 03/09/2020   No Known Allergies     Medication List    STOP taking these medications    clindamycin 300 MG capsule Commonly known as: Cleocin   doxycycline 100 MG tablet Commonly known as: VIBRA-TABS   hydrOXYzine 10 MG tablet Commonly known as: ATARAX/VISTARIL   terbinafine 250 MG tablet Commonly known as: LamISIL     TAKE these medications   benzonatate 200 MG capsule Commonly known as: TESSALON Take 1 capsule (200 mg total) by mouth 3 (three) times daily for 7 days.   Buprenorphine HCl-Naloxone HCl 8-2 MG Film Place 0.5 applicators under the tongue every 8 (eight) hours.   nicotine 21 mg/24hr patch Commonly known as: NICODERM CQ - dosed in mg/24 hours Place 1 patch (21 mg total) onto the skin daily. Start taking on: March 10, 2020   predniSONE 20 MG tablet Commonly known as: Deltasone Take 2 tablets (40 mg total) by mouth daily for 5 days.       Follow-up Information    Vida Rigger, MD In 2 weeks.   Specialty: Pulmonary Disease Why: Needs to see pulmon, Dr. Karna Christmas, in 2 weeks to get pulmonary function tests done. Please call to  make your follow up. Contact information: [address] [place] [address] [phone]              No Known Allergies  Consultations:     Procedures/Studies: DG Chest 2 View  Result Date: 03/07/2020 CLINICAL DATA:  Cough and shortness of breath EXAM: CHEST - 2 VIEW COMPARISON:  10/15/2015 FINDINGS: The heart size and mediastinal contours are within normal limits. Both lungs are clear. The visualized skeletal structures are unremarkable. IMPRESSION: No active cardiopulmonary disease. Electronically Signed   By: Signa Kell M.D.   On: 03/07/2020 14:06   CT HEAD WO CONTRAST  Result Date: 03/08/2020 CLINICAL DATA:  TIA versus CVA. EXAM: CT HEAD WITHOUT CONTRAST TECHNIQUE: Contiguous axial images were obtained from the base of the skull through the vertex without intravenous contrast. COMPARISON:  None. FINDINGS: Brain: No evidence of acute large vascular territory infarction, hemorrhage,  hydrocephalus, extra-axial collection or mass lesion/mass effect. Vascular: No hyperdense vessel or unexpected calcification. Skull: No acute fracture. Sinuses/Orbits: Visualized sinuses are largely clear. Unremarkable visualized orbits. Other: No mastoid effusions. IMPRESSION: No evidence of acute intracranial abnormality. Electronically Signed   By: Feliberto Harts MD   On: 03/08/2020 19:57   DG Chest Port 1 View  Result Date: 03/08/2020 CLINICAL DATA:  Dyspnea. EXAM: PORTABLE CHEST 1 VIEW COMPARISON:  Radiograph yesterday FINDINGS: The cardiomediastinal contours are normal. Mild peribronchial thickening. Pulmonary vasculature is normal. No consolidation, pleural effusion, or pneumothorax. No acute osseous abnormalities are seen. IMPRESSION: Mild peribronchial thickening may be related to smoking, bronchitis or asthma. Electronically Signed   By: Narda Rutherford M.D.   On: 03/08/2020 18:14    Subjective: Pt c/o intermittent cough    Discharge Exam: Vitals:   03/09/20 0921 03/09/20 1358  BP:    Pulse: 100 (!) 101  Resp:  18  Temp:    SpO2: 95% 91%   Vitals:   03/09/20 0746 03/09/20 0807 03/09/20 0921 03/09/20 1358  BP:  112/60    Pulse: 85 91 100 (!) 101  Resp: 18  18  18  Temp:      TempSrc:      SpO2: 95% 98% 95% 91%  Weight:      Height:        General: Pt is alert, awake, not in acute distress Cardiovascular:S1/S2 +, no rubs, no gallops Respiratory: course breath sounds b/l Abdominal: Soft, NT, ND, bowel sounds + Extremities: no edema, no cyanosis    The results of significant diagnostics from this hospitalization (including imaging, microbiology, ancillary and laboratory) are listed below for reference.     Microbiology: Recent Results (from the past 240 hour(s))  Resp Panel by RT-PCR (Flu A&B, Covid) Nasopharyngeal Swab     Status: None   Collection Time: 03/07/20  4:38 PM   Specimen: Nasopharyngeal Swab; Nasopharyngeal(NP) swabs in vial transport medium   Result Value Ref Range Status   SARS Coronavirus 2 by RT PCR NEGATIVE NEGATIVE Final    Comment: (NOTE) SARS-CoV-2 target nucleic acids are NOT DETECTED.  The SARS-CoV-2 RNA is generally detectable in upper respiratory specimens during the acute phase of infection. The lowest concentration of SARS-CoV-2 viral copies this assay can detect is 138 copies/mL. A negative result does not preclude SARS-Cov-2 infection and should not be used as the sole basis for treatment or other patient management decisions. A negative result may occur with  improper specimen collection/handling, submission of specimen other than nasopharyngeal swab, presence of viral mutation(s) within the areas targeted by this assay, and inadequate number of viral copies(<138 copies/mL). A negative result must be combined with clinical observations, patient history, and epidemiological information. The expected result is Negative.  Fact Sheet for Patients:  BloggerCourse.com  Fact Sheet for Healthcare Providers:  SeriousBroker.it  This test is no t yet approved or cleared by the [place] FDA and  has been authorized for detection and/or diagnosis of SARS-CoV-2 by FDA under an Emergency Use Authorization (EUA). This EUA will remain  in effect (meaning this test can be used) for the duration of the COVID-19 declaration under Section 564(b)(1) of the Act, 21 U.S.C.section 360bbb-3(b)(1), unless the authorization is terminated  or revoked sooner.       Influenza A by PCR NEGATIVE NEGATIVE Final   Influenza B by PCR NEGATIVE NEGATIVE Final    Comment: (NOTE) The Xpert Xpress SARS-CoV-2/FLU/RSV plus assay is intended as an aid in the diagnosis of influenza from Nasopharyngeal swab specimens and should not be used as a sole basis for treatment. Nasal washings and aspirates are unacceptable for Xpert Xpress SARS-CoV-2/FLU/RSV testing.  Fact Sheet for  Patients: BloggerCourse.com  Fact Sheet for Healthcare Providers: SeriousBroker.it  This test is not yet approved or cleared by the [place] FDA and has been authorized for detection and/or diagnosis of SARS-CoV-2 by FDA under an Emergency Use Authorization (EUA). This EUA will remain in effect (meaning this test can be used) for the duration of the COVID-19 declaration under Section 564(b)(1) of the Act, 21 U.S.C. section 360bbb-3(b)(1), unless the authorization is terminated or revoked.  Performed at [facility], [address] [place], [place] [address]      Labs: BNP (last 3 results) No results for input(s): BNP in the last 8760 hours. Basic Metabolic Panel: Recent Labs  Lab 03/07/20 1638 03/07/20 2342 03/08/20 0251 03/09/20 0637  NA 139  --  136 141  K 4.0  --  3.8 5.0  CL 102  --  103 106  CO2 27  --  24 26  GLUCOSE 103*  --  208* 124*  BUN 5*  --  6 10  CREATININE 0.66 0.75 0.78 0.60*  CALCIUM 9.2  --  9.1 9.3   Liver Function Tests: Recent Labs  Lab 03/08/20 0251  AST 22  ALT 10  ALKPHOS 70  BILITOT 0.6  PROT 7.1  ALBUMIN 4.2   No results for input(s): LIPASE, AMYLASE in the last 168 hours. No results for input(s): AMMONIA in the last 168 hours. CBC: Recent Labs  Lab 03/07/20 1638 03/07/20 2342 03/08/20 0251 03/09/20 0637  WBC 10.7* 10.7* 9.6 18.4*  NEUTROABS 5.8  --   --   --   HGB 15.9 14.6 15.0 14.7  HCT 47.9 44.4 43.8 46.3  MCV 87.6 86.7 86.2 90.3  PLT 309 281 290 326   Cardiac Enzymes: No results for input(s): CKTOTAL, CKMB, CKMBINDEX, TROPONINI in the last 168 hours. BNP: Invalid input(s): POCBNP CBG: No results for input(s): GLUCAP in the last 168 hours. D-Dimer No results for input(s): DDIMER in the last 72 hours. Hgb A1c Recent Labs    03/07/20 2342  HGBA1C 5.7*   Lipid Profile No results for input(s): CHOL, HDL, LDLCALC, TRIG, CHOLHDL, LDLDIRECT in the  last 72 hours. Thyroid function studies No results for input(s): TSH, T4TOTAL, T3FREE, THYROIDAB in the last 72 hours.  Invalid input(s): FREET3 Anemia work up No results for input(s): VITAMINB12, FOLATE, FERRITIN, TIBC, IRON, RETICCTPCT in the last 72 hours. Urinalysis No results found for: COLORURINE, APPEARANCEUR, LABSPEC, PHURINE, GLUCOSEU, HGBUR, BILIRUBINUR, KETONESUR, PROTEINUR, UROBILINOGEN, NITRITE, LEUKOCYTESUR Sepsis Labs Invalid input(s): PROCALCITONIN,  WBC,  LACTICIDVEN Microbiology Recent Results (from the past 240 hour(s))  Resp Panel by RT-PCR (Flu A&B, Covid) Nasopharyngeal Swab     Status: None   Collection Time: 03/07/20  4:38 PM   Specimen: Nasopharyngeal Swab; Nasopharyngeal(NP) swabs in vial transport medium  Result Value Ref Range Status   SARS Coronavirus 2 by RT PCR NEGATIVE NEGATIVE Final    Comment: (NOTE) SARS-CoV-2 target nucleic acids are NOT DETECTED.  The SARS-CoV-2 RNA is generally detectable in upper respiratory specimens during the acute phase of infection. The lowest concentration of SARS-CoV-2 viral copies this assay can detect is 138 copies/mL. A negative result does not preclude SARS-Cov-2 infection and should not be used as the sole basis for treatment or other patient management decisions. A negative result may occur with  improper specimen collection/handling, submission of specimen other than nasopharyngeal swab, presence of viral mutation(s) within the areas targeted by this assay, and inadequate number of viral copies(<138 copies/mL). A negative result must be combined with clinical observations, patient history, and epidemiological information. The expected result is Negative.  Fact Sheet for Patients:  BloggerCourse.com  Fact Sheet for Healthcare Providers:  SeriousBroker.it  This test is no t yet approved or cleared by the [place] FDA and  has been authorized for detection  and/or diagnosis of SARS-CoV-2 by FDA under an Emergency Use Authorization (EUA). This EUA will remain  in effect (meaning this test can be used) for the duration of the COVID-19 declaration under Section 564(b)(1) of the Act, 21 U.S.C.section 360bbb-3(b)(1), unless the authorization is terminated  or revoked sooner.       Influenza A by PCR NEGATIVE NEGATIVE Final   Influenza B by PCR NEGATIVE NEGATIVE Final    Comment: (NOTE) The Xpert Xpress SARS-CoV-2/FLU/RSV plus assay is intended as an aid in the diagnosis of influenza from Nasopharyngeal swab specimens and should not be used as a sole basis for treatment. Nasal washings and  aspirates are unacceptable for Xpert Xpress SARS-CoV-2/FLU/RSV testing.  Fact Sheet for Patients: BloggerCourse.com  Fact Sheet for Healthcare Providers: SeriousBroker.it  This test is not yet approved or cleared by the [place] FDA and has been authorized for detection and/or diagnosis of SARS-CoV-2 by FDA under an Emergency Use Authorization (EUA). This EUA will remain in effect (meaning this test can be used) for the duration of the COVID-19 declaration under Section 564(b)(1) of the Act, 21 U.S.C. section 360bbb-3(b)(1), unless the authorization is terminated or revoked.  Performed at [facility], [address] [place], [place] [address]      Time coordinating discharge: Over 30 minutes  SIGNED:   Charise Killian, MD  [facility] 03/09/2020, 4:57 PM Pager   If 7PM-7AM, please contact night-coverage

## 2020-03-09 NOTE — Progress Notes (Signed)
Patient placed on room air at this time.

## 2021-03-02 ENCOUNTER — Other Ambulatory Visit: Payer: Self-pay

## 2021-03-02 ENCOUNTER — Encounter: Payer: Self-pay | Admitting: Emergency Medicine

## 2021-03-02 ENCOUNTER — Emergency Department: Payer: Medicaid Other

## 2021-03-02 DIAGNOSIS — F17210 Nicotine dependence, cigarettes, uncomplicated: Secondary | ICD-10-CM | POA: Insufficient documentation

## 2021-03-02 DIAGNOSIS — R42 Dizziness and giddiness: Secondary | ICD-10-CM | POA: Diagnosis not present

## 2021-03-02 DIAGNOSIS — R0789 Other chest pain: Secondary | ICD-10-CM | POA: Diagnosis not present

## 2021-03-02 DIAGNOSIS — Z20822 Contact with and (suspected) exposure to covid-19: Secondary | ICD-10-CM | POA: Insufficient documentation

## 2021-03-02 LAB — CBC
HCT: 43.2 % (ref 39.0–52.0)
Hemoglobin: 14 g/dL (ref 13.0–17.0)
MCH: 28.5 pg (ref 26.0–34.0)
MCHC: 32.4 g/dL (ref 30.0–36.0)
MCV: 87.8 fL (ref 80.0–100.0)
Platelets: 251 10*3/uL (ref 150–400)
RBC: 4.92 MIL/uL (ref 4.22–5.81)
RDW: 12.7 % (ref 11.5–15.5)
WBC: 8.1 10*3/uL (ref 4.0–10.5)
nRBC: 0 % (ref 0.0–0.2)

## 2021-03-02 LAB — BASIC METABOLIC PANEL
Anion gap: 5 (ref 5–15)
BUN: 7 mg/dL (ref 6–20)
CO2: 30 mmol/L (ref 22–32)
Calcium: 9.4 mg/dL (ref 8.9–10.3)
Chloride: 100 mmol/L (ref 98–111)
Creatinine, Ser: 0.55 mg/dL — ABNORMAL LOW (ref 0.61–1.24)
GFR, Estimated: >60 mL/min (ref >60)
Glucose, Bld: 94 mg/dL (ref 70–99)
Potassium: 3.9 mmol/L (ref 3.5–5.1)
Sodium: 135 mmol/L (ref 135–145)

## 2021-03-02 LAB — TROPONIN I (HIGH SENSITIVITY): Troponin I (High Sensitivity): <2 ng/L (ref <18)

## 2021-03-02 NOTE — ED Triage Notes (Signed)
Pt arrived via POV with c/o CP since yesterday and dizziness x 2 weeks. Pt c/o body aches as well as weakness. Pt reports migraines as well. Pt states he has multiple medical complaints.  No distress noted at this time.

## 2021-03-03 ENCOUNTER — Emergency Department
Admission: EM | Admit: 2021-03-03 | Discharge: 2021-03-03 | Disposition: A | Discharge status: Home / Self Care | Payer: Medicaid Other | Attending: Emergency Medicine | Admitting: Emergency Medicine

## 2021-03-03 DIAGNOSIS — R42 Dizziness and giddiness: Secondary | ICD-10-CM

## 2021-03-03 DIAGNOSIS — R0789 Other chest pain: Secondary | ICD-10-CM

## 2021-03-03 DIAGNOSIS — R079 Chest pain, unspecified: Secondary | ICD-10-CM

## 2021-03-03 LAB — RESP PANEL BY RT-PCR (FLU A&B, COVID) ARPGX2
Influenza A by PCR: NEGATIVE
Influenza B by PCR: NEGATIVE
SARS Coronavirus 2 by RT PCR: NEGATIVE

## 2021-03-03 LAB — CK: Total CK: 106 U/L (ref 49–397)

## 2021-03-03 LAB — TROPONIN I (HIGH SENSITIVITY): Troponin I (High Sensitivity): 3 ng/L (ref <18)

## 2021-03-03 MED ORDER — SODIUM CHLORIDE 0.9 % IV BOLUS
1000.0000 mL | Freq: Once | INTRAVENOUS | Status: AC
  Administered 2021-03-03: 1000 mL via INTRAVENOUS

## 2021-03-03 NOTE — Discharge Instructions (Signed)
1.  Drink plenty of fluids daily. °2.  Return to the ER for worsening symptoms, persistent vomiting, difficulty breathing or other concerns. °

## 2021-03-03 NOTE — ED Notes (Signed)
ED Provider at bedside. 

## 2021-03-03 NOTE — ED Notes (Signed)
Pt reports being here due to concern for sepsis because pt had tooth abscess approx 3 months ago. Pt just started antibiotics 1 week ago. Pt reports CP that began this am but has felt dizzy x1 week and reports three syncopal episodes this week.

## 2021-03-03 NOTE — ED Provider Notes (Signed)
[facility] Emergency Department Provider Note   ____________________________________________   Event Date/Time   First MD Initiated Contact with Patient 03/03/21 [phone]     (approximate)  I have reviewed the triage vital signs and the nursing notes.   HISTORY  Chief Complaint Chest Pain and Dizziness    HPI Hunter Kelly is a 30 y.o. male who presents to the ED from work at [employer] with a chief complaint of dizziness x2 weeks and chest pain since yesterday.  Patient describes a feeling of lightheadedness and room spinning especially on standing which is worsened when he turns his head from side to side.  Point tender left anterior chest pain since yesterday, nonradiating and not associated with diaphoresis, nausea/vomiting, palpitations or shortness of breath.  Complains of generalized weakness as well as body aches and migraine headaches.  Denies fever, chills, cough, abdominal pain, diarrhea.  Takes Suboxone.  Denies recent travel, trauma or hormone use.  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     Past medical history Narcotic abuse on Suboxone Tobacco abuse, 2021 hospitalization for acute respiratory failure, unknown etiology.  Patient Active Problem List   Diagnosis Date Noted   Respiratory failure (HCC) 03/08/2020   Acute respiratory failure with hypoxia (HCC) 03/07/2020    Past Surgical History:  Procedure Laterality Date   WISDOM TOOTH EXTRACTION      Prior to Admission medications   Medication Sig Start Date End Date Taking? Authorizing Provider  Buprenorphine HCl-Naloxone HCl 8-2 MG FILM Place 0.5 applicators under the tongue every 8 (eight) hours.    [provider]    Allergies Patient has no known allergies.  History reviewed. No pertinent family history.  Social History Social History   Tobacco Use   Smoking status: Every Day    Packs/day: 1.00    Types: Cigarettes   Smokeless  tobacco: Never  Vaping Use   Vaping Use: Never used  Substance Use Topics   Alcohol use: No   Drug use: No    Review of Systems  Constitutional: No fever/chills Eyes: No visual changes. ENT: No sore throat. Cardiovascular: Positive for chest pain. Respiratory: Denies shortness of breath. Gastrointestinal: No abdominal pain.  No nausea, no vomiting.  No diarrhea.  No constipation. Genitourinary: Negative for dysuria. Musculoskeletal: Negative for back pain. Skin: Negative for rash. Neurological: Positive for dizziness.  Negative for headaches, focal weakness or numbness.   ____________________________________________   PHYSICAL EXAM:  VITAL SIGNS: ED Triage Vitals  Enc Vitals Group     BP 03/02/21 2045 131/78     Pulse Rate 03/02/21 2045 85     Resp 03/02/21 2045 18     Temp 03/02/21 2045 98.6 F (37 C)     Temp Source 03/02/21 2045 Oral     SpO2 03/02/21 2045 100 %     Weight 03/02/21 2045 145 lb (65.8 kg)     Height 03/02/21 2045 5\' 8"  (1.727 m)     Head Circumference --      Peak Flow --      Pain Score 03/02/21 2103 10     Pain Loc --      Pain Edu? --      Excl. in GC? --     Constitutional: Alert and oriented. Well appearing and in no acute distress. Eyes: Conjunctivae are normal. PERRL. EOMI. Head: Atraumatic. Nose: No congestion/rhinnorhea. Mouth/Throat: Mucous membranes are mildly dry. Neck: No stridor.  Cardiovascular: Normal rate, regular rhythm. Grossly normal heart sounds.  Good peripheral circulation. Respiratory: Normal respiratory effort.  No retractions. Lungs CTAB.  Left anterior ribs point tender to palpation.  No crepitus.  No splinting.  No external evidence of injury. Gastrointestinal: Soft and nontender to light or deep palpation. No distention. No abdominal bruits. No CVA tenderness. Musculoskeletal: No lower extremity tenderness nor edema.  No joint effusions. Neurologic: Alert and oriented x3.  CN II to XII grossly intact.  Normal  speech and language. No gross focal neurologic deficits are appreciated. No gait instability. Skin:  Skin is warm, dry and intact. No rash noted. Psychiatric: Mood and affect are normal. Speech and behavior are normal.  ____________________________________________   LABS (all labs ordered are listed, but only abnormal results are displayed)  Labs Reviewed  BASIC METABOLIC PANEL - Abnormal; Notable for the following components:      Result Value   Creatinine, Ser 0.55 (*)    All other components within normal limits  RESP PANEL BY RT-PCR (FLU A&B, COVID) ARPGX2  CBC  CK  TROPONIN I (HIGH SENSITIVITY)  TROPONIN I (HIGH SENSITIVITY)   ____________________________________________  EKG  ED ECG REPORT I, Angeletta Goelz J, the attending physician, personally viewed and interpreted this ECG.   Date: 03/03/2021  EKG Time: 2041  Rate: 74  Rhythm: normal sinus rhythm  Axis: Normal  Intervals:none  ST&T Change: Nonspecific  ____________________________________________  RADIOLOGY I, Cheyna Retana J, personally viewed and evaluated these images (plain radiographs) as part of my medical decision making, as well as reviewing the written report by the radiologist.  ED MD interpretation: No acute cardiopulmonary process  Official radiology report(s): DG Chest 2 View  Result Date: 03/02/2021 CLINICAL DATA:  Chest pain EXAM: CHEST - 2 VIEW COMPARISON:  03/08/2020 FINDINGS: The heart size and mediastinal contours are within normal limits. Both lungs are clear. The visualized skeletal structures are unremarkable. IMPRESSION: No active cardiopulmonary disease. Electronically Signed   By: Donavan Foil M.D.   On: 03/02/2021 21:27    ____________________________________________   PROCEDURES  Procedure(s) performed (including Critical Care):  .1-3 Lead EKG Interpretation Performed by: Paulette Blanch, MD Authorized by: Paulette Blanch, MD     Interpretation: normal     ECG rate:  75   ECG rate  assessment: normal     Rhythm: sinus rhythm     Ectopy: none     Conduction: normal   Comments:     Patient placed on cardiac monitor to evaluate for arrhythmias   ____________________________________________   INITIAL IMPRESSION / ASSESSMENT AND PLAN / ED COURSE  As part of my medical decision making, I reviewed the following data within the [place] notes reviewed and incorporated, Labs reviewed, EKG interpreted, Old chart reviewed, Radiograph reviewed, and Notes from prior ED visits     30 year old male presenting with dizziness and chest pain. Differential diagnosis includes, but is not limited to, ACS, aortic dissection, pulmonary embolism, cardiac tamponade, pneumothorax, pneumonia, pericarditis, myocarditis, GI-related causes including esophagitis/gastritis, and musculoskeletal chest wall pain.     Laboratory results unremarkable; chest wall pain point tender to palpation.  Repeat troponin as well as respiratory panel and orthostatic vital signs are pending.  Will reassess.  Clinical Course as of 03/03/21 [id number]  Sat Mar 03, 2021  0227 Patient resting in no acute distress, feeling better.  Updated him on all test results.  He will be discharged after completion of IV fluids.  Strict return precautions given.  Patient verbalizes understanding agrees with plan of care. [JS]    Clinical Course User Index [JS] Paulette Blanch, MD     ____________________________________________   FINAL CLINICAL IMPRESSION(S) / ED DIAGNOSES  Final diagnoses:  Dizziness  Chest pain, unspecified type  Chest wall pain     ED Discharge Orders     None        Note:  This document was prepared using Dragon voice recognition software and may include unintentional dictation errors.    Paulette Blanch, MD 03/03/21 [phone]

## 2021-09-17 ENCOUNTER — Other Ambulatory Visit: Payer: Self-pay | Admitting: Family Medicine

## 2021-09-17 DIAGNOSIS — N63 Unspecified lump in unspecified breast: Secondary | ICD-10-CM

## 2021-10-04 ENCOUNTER — Ambulatory Visit
Admission: RE | Admit: 2021-10-04 | Discharge: 2021-10-04 | Disposition: A | Discharge status: Home / Self Care | Payer: Medicaid Other | Source: Ambulatory Visit | Attending: Family Medicine | Admitting: Family Medicine

## 2021-10-04 DIAGNOSIS — L723 Sebaceous cyst: Secondary | ICD-10-CM | POA: Diagnosis not present

## 2021-10-04 DIAGNOSIS — N63 Unspecified lump in unspecified breast: Secondary | ICD-10-CM | POA: Diagnosis present

## 2021-10-31 ENCOUNTER — Other Ambulatory Visit (INDEPENDENT_AMBULATORY_CARE_PROVIDER_SITE_OTHER): Payer: Self-pay | Admitting: Nurse Practitioner

## 2021-10-31 DIAGNOSIS — I739 Peripheral vascular disease, unspecified: Secondary | ICD-10-CM

## 2021-10-31 DIAGNOSIS — M79606 Pain in leg, unspecified: Secondary | ICD-10-CM | POA: Insufficient documentation

## 2021-10-31 DIAGNOSIS — L819 Disorder of pigmentation, unspecified: Secondary | ICD-10-CM

## 2021-10-31 NOTE — Progress Notes (Deleted)
MRN : [id number]  Hunter Kelly is a 31 y.o. ([birth year]) male who presents with chief complaint of check circulation.  History of Present Illness: ***  No outpatient medications have been marked as taking for the 11/01/21 encounter (Appointment) with Gilda Crease, Latina Craver, MD.    No past medical history on file.  Past Surgical History:  Procedure Laterality Date   WISDOM TOOTH EXTRACTION      Social History Social History   Tobacco Use   Smoking status: Every Day    Packs/day: 1.00    Types: Cigarettes   Smokeless tobacco: Never  Vaping Use   Vaping Use: Never used  Substance Use Topics   Alcohol use: No   Drug use: No    Family History Family History  Problem Relation Age of Onset   Breast cancer Maternal Grandmother     No Known Allergies   REVIEW OF SYSTEMS (Negative unless checked)  Constitutional: [] Weight loss  [] Fever  [] Chills Cardiac: [] Chest pain   [] Chest pressure   [] Palpitations   [] Shortness of breath when laying flat   [] Shortness of breath with exertion. Vascular:  [x] Pain in legs with walking   [] Pain in legs at rest  [] History of DVT   [] Phlebitis   [] Swelling in legs   [] Varicose veins   [] Non-healing ulcers Pulmonary:   [] Uses home oxygen   [] Productive cough   [] Hemoptysis   [] Wheeze  [] COPD   [] Asthma Neurologic:  [] Dizziness   [] Seizures   [] History of stroke   [] History of TIA  [] Aphasia   [] Vissual changes   [] Weakness or numbness in arm   [] Weakness or numbness in leg Musculoskeletal:   [] Joint swelling   [] Joint pain   [] Low back pain Hematologic:  [] Easy bruising  [] Easy bleeding   [] Hypercoagulable state   [] Anemic Gastrointestinal:  [] Diarrhea   [] Vomiting  [] Gastroesophageal reflux/heartburn   [] Difficulty swallowing. Genitourinary:  [] Chronic kidney disease   [] Difficult urination  [] Frequent urination   [] Blood in urine Skin:  [] Rashes   [] Ulcers  Psychological:  [] History of anxiety   []  History of major  depression.  Physical Examination  There were no vitals filed for this visit. There is no height or weight on file to calculate BMI. Gen: WD/WN, NAD Head: [place]/AT, No temporalis wasting.  Ear/Nose/Throat: Hearing grossly intact, nares w/o erythema or drainage Eyes: PER, EOMI, sclera nonicteric.  Neck: Supple, no masses.  No bruit or JVD.  Pulmonary:  Good air movement, no audible wheezing, no use of accessory muscles.  Cardiac: RRR, normal S1, S2, no Murmurs. Vascular:  mild trophic changes, no open wounds Vessel Right Left  Radial Palpable Palpable  PT Not Palpable Not Palpable  DP Not Palpable Not Palpable  Gastrointestinal: soft, non-distended. No guarding/no peritoneal signs.  Musculoskeletal: M/S 5/5 throughout.  No visible deformity.  Neurologic: CN 2-12 intact. Pain and light touch intact in extremities.  Symmetrical.  Speech is fluent. Motor exam as listed above. Psychiatric: Judgment intact, Mood & affect appropriate for pt's clinical situation. Dermatologic: No rashes or ulcers noted.  No changes consistent with cellulitis.   CBC Lab Results  Component Value Date   WBC 8.1 03/02/2021   HGB 14.0 03/02/2021   HCT 43.2 03/02/2021   MCV 87.8 03/02/2021   PLT 251 03/02/2021    BMET    Component Value Date/Time   NA 135 03/02/2021 2046   K 3.9 03/02/2021 2046  CL 100 03/02/2021 2046   CO2 30 03/02/2021 2046   GLUCOSE 94 03/02/2021 2046   BUN 7 03/02/2021 2046   CREATININE 0.55 (L) 03/02/2021 2046   CALCIUM 9.4 03/02/2021 2046   GFRNONAA >60 03/02/2021 2046   GFRAA >60 10/14/2015 2128   CrCl cannot be calculated (Patient's most recent lab result is older than the maximum 21 days allowed.).  COAG No results found for: "INR", "PROTIME"  Radiology MM DIAG BREAST TOMO BILATERAL  Result Date: 10/04/2021 CLINICAL DATA:  31 year old male with a palpable area of concern in the right axilla as well as a reported palpable area of concern in the right breast at the  approximate 9 o'clock position felt by the patient's provider. EXAM: DIGITAL DIAGNOSTIC BILATERAL MAMMOGRAM WITH TOMOSYNTHESIS AND CAD; ULTRASOUND RIGHT BREAST LIMITED TECHNIQUE: Bilateral digital diagnostic mammography and breast tomosynthesis was performed. The images were evaluated with computer-aided detection.; Targeted ultrasound examination of the right breast was performed COMPARISON:  Previous exam(s). ACR Breast Density Category a: The breast tissue is almost entirely fatty. FINDINGS: No suspicious masses or calcifications are seen in either breast. Spot compression tomograms were performed over the area of concern in the right axilla with no definite abnormality seen. Physical examination of the right axilla reveals a firm raised area of thickening within the skin. Targeted ultrasound of the right axilla was performed. There is a hypoechoic collection within the skin of the right axilla measuring 0.6 x 0.4 x 1.5 cm. Findings are most suggestive of an inflamed sebaceous cyst. IMPRESSION: Inflamed sebaceous cyst at site of palpable concern in the right axilla. RECOMMENDATION: Clinical follow-up for inflamed sebaceous cyst in the right axilla. If the patient notices enlargement of the sebaceous cyst, increasing tenderness or purulent drainage he has been instructed to let his provider as he may benefit from a course of antibiotics. I have discussed the findings and recommendations with the patient. If applicable, a reminder letter will be sent to the patient regarding the next appointment. BI-RADS CATEGORY  2: Benign. Electronically Signed   By: Edwin Cap M.D.   On: 10/04/2021 15:31  US BREAST LTD UNI RIGHT INC AXILLA  Result Date: 10/04/2021 CLINICAL DATA:  31 year old male with a palpable area of concern in the right axilla as well as a reported palpable area of concern in the right breast at the approximate 9 o'clock position felt by the patient's provider. EXAM: DIGITAL DIAGNOSTIC BILATERAL  MAMMOGRAM WITH TOMOSYNTHESIS AND CAD; ULTRASOUND RIGHT BREAST LIMITED TECHNIQUE: Bilateral digital diagnostic mammography and breast tomosynthesis was performed. The images were evaluated with computer-aided detection.; Targeted ultrasound examination of the right breast was performed COMPARISON:  Previous exam(s). ACR Breast Density Category a: The breast tissue is almost entirely fatty. FINDINGS: No suspicious masses or calcifications are seen in either breast. Spot compression tomograms were performed over the area of concern in the right axilla with no definite abnormality seen. Physical examination of the right axilla reveals a firm raised area of thickening within the skin. Targeted ultrasound of the right axilla was performed. There is a hypoechoic collection within the skin of the right axilla measuring 0.6 x 0.4 x 1.5 cm. Findings are most suggestive of an inflamed sebaceous cyst. IMPRESSION: Inflamed sebaceous cyst at site of palpable concern in the right axilla. RECOMMENDATION: Clinical follow-up for inflamed sebaceous cyst in the right axilla. If the patient notices enlargement of the sebaceous cyst, increasing tenderness or purulent drainage he has been instructed to let his provider as he may benefit  from a course of antibiotics. I have discussed the findings and recommendations with the patient. If applicable, a reminder letter will be sent to the patient regarding the next appointment. BI-RADS CATEGORY  2: Benign. Electronically Signed   By: Edwin Cap M.D.   On: 10/04/2021 15:31    Assessment/Plan There are no diagnoses linked to this encounter.   Levora Dredge, MD  10/31/2021 3:09 PM

## 2021-11-01 ENCOUNTER — Encounter (INDEPENDENT_AMBULATORY_CARE_PROVIDER_SITE_OTHER): Payer: Medicaid Other | Admitting: Vascular Surgery

## 2021-11-01 ENCOUNTER — Encounter (INDEPENDENT_AMBULATORY_CARE_PROVIDER_SITE_OTHER): Payer: Medicaid Other

## 2021-11-01 DIAGNOSIS — M79606 Pain in leg, unspecified: Secondary | ICD-10-CM

## 2021-11-09 ENCOUNTER — Ambulatory Visit: Payer: Medicaid Other | Admitting: Podiatry

## 2021-11-09 DIAGNOSIS — L03032 Cellulitis of left toe: Secondary | ICD-10-CM

## 2021-11-09 DIAGNOSIS — B351 Tinea unguium: Secondary | ICD-10-CM | POA: Diagnosis not present

## 2021-11-09 DIAGNOSIS — L600 Ingrowing nail: Secondary | ICD-10-CM | POA: Diagnosis not present

## 2021-11-09 DIAGNOSIS — M79674 Pain in right toe(s): Secondary | ICD-10-CM | POA: Diagnosis not present

## 2021-11-09 DIAGNOSIS — M79675 Pain in left toe(s): Secondary | ICD-10-CM | POA: Diagnosis not present

## 2021-11-09 MED ORDER — DOXYCYCLINE HYCLATE 100 MG PO TABS: 100.0000 mg | ORAL_TABLET | Freq: Two times a day (BID) | ORAL | 0 refills | Status: DC

## 2021-11-09 MED ORDER — GENTAMICIN SULFATE 0.1 % EX CREA: 1.0000 | TOPICAL_CREAM | Freq: Two times a day (BID) | CUTANEOUS | 1 refills | Status: DC

## 2021-11-09 NOTE — Progress Notes (Signed)
   Chief Complaint  Patient presents with   Nail Problem    Left great toenail possible infection.    Subjective: Patient presents today with a new complaint of pain and tenderness associated to the left great toe.  Patient states that he has a history of injuring the toe.  He also states that he has a history of partial nail matricectomy's to the left great toenail.  Most recently over the past few weeks he has been experiencing increased pain and tenderness to the left great toe with redness.  He is concerned for possible infection.  He has not done anything for treatment.   Patient also complains of elongated thickened hypersensitive nails to the bilateral feet.  He is requesting a nail trim today he presents for further treatment and evaluation  No past medical history on file.  Objective:  General: Well developed, nourished, in no acute distress, alert and oriented x3   Dermatology: Skin is warm, dry and supple bilateral.  Left hallux nail plate is tender with evidence of an ingrowing completely dystrophic nail. Pain on palpation  Remaining nails are elongated and dystrophic with hypersensitivity consistent with onychomycosis.  Vascular: DP and PT pulses palpable.  No clinical evidence of vascular compromise  Neruologic: Grossly intact via light touch bilateral.  Musculoskeletal: No pedal deformity noted  Assesement: #1  Left great toe cellulitis with ingrowing dystrophic nail  #2 pain due to onychomycosis of toenails both  Plan of Care:  1. Patient evaluated.  2. Discussed treatment alternatives and plan of care. Explained nail avulsion procedure and post procedure course to patient. 3. Patient opted for permanent toe roll nail avulsion of the ingrown portion of the nail.  4. Prior to procedure, local anesthesia infiltration utilized using 3 ml of a 50:50 mixture of 2% plain lidocaine and 0.5% plain marcaine in a normal hallux block fashion and a betadine prep performed.  5.  Partial permanent nail avulsion with chemical matrixectomy performed using 3x30sec applications of phenol followed by alcohol flush.  6. Light dressing applied.  Post care instructions provided 7.  Prescription for gentamicin 2% cream  8.  Prescription for doxycycline 100 mg 2 times daily #20  9.  Mechanical debridement of nails 1-5 bilateral was performed using a nail nipper without incident or bleeding. 10.  Return to clinic 3 weeks  Felecia Shelling, DPM [facility]  Dr. Felecia Shelling, DPM    [address] [place], [place] [address]                Office [phone]  Fax [phone]

## 2021-11-22 IMAGING — CR DG CHEST 2V
1 series · 2 of 2 positions shown · non-contrast
Comparison: 10/15/2015

CLINICAL DATA: Cough and shortness of breath

EXAM:
CHEST - 2 VIEW

[Series 1: dg chest 2 view · 0.14mm/px · 2 of 2 slices shown]
[im 1/2]
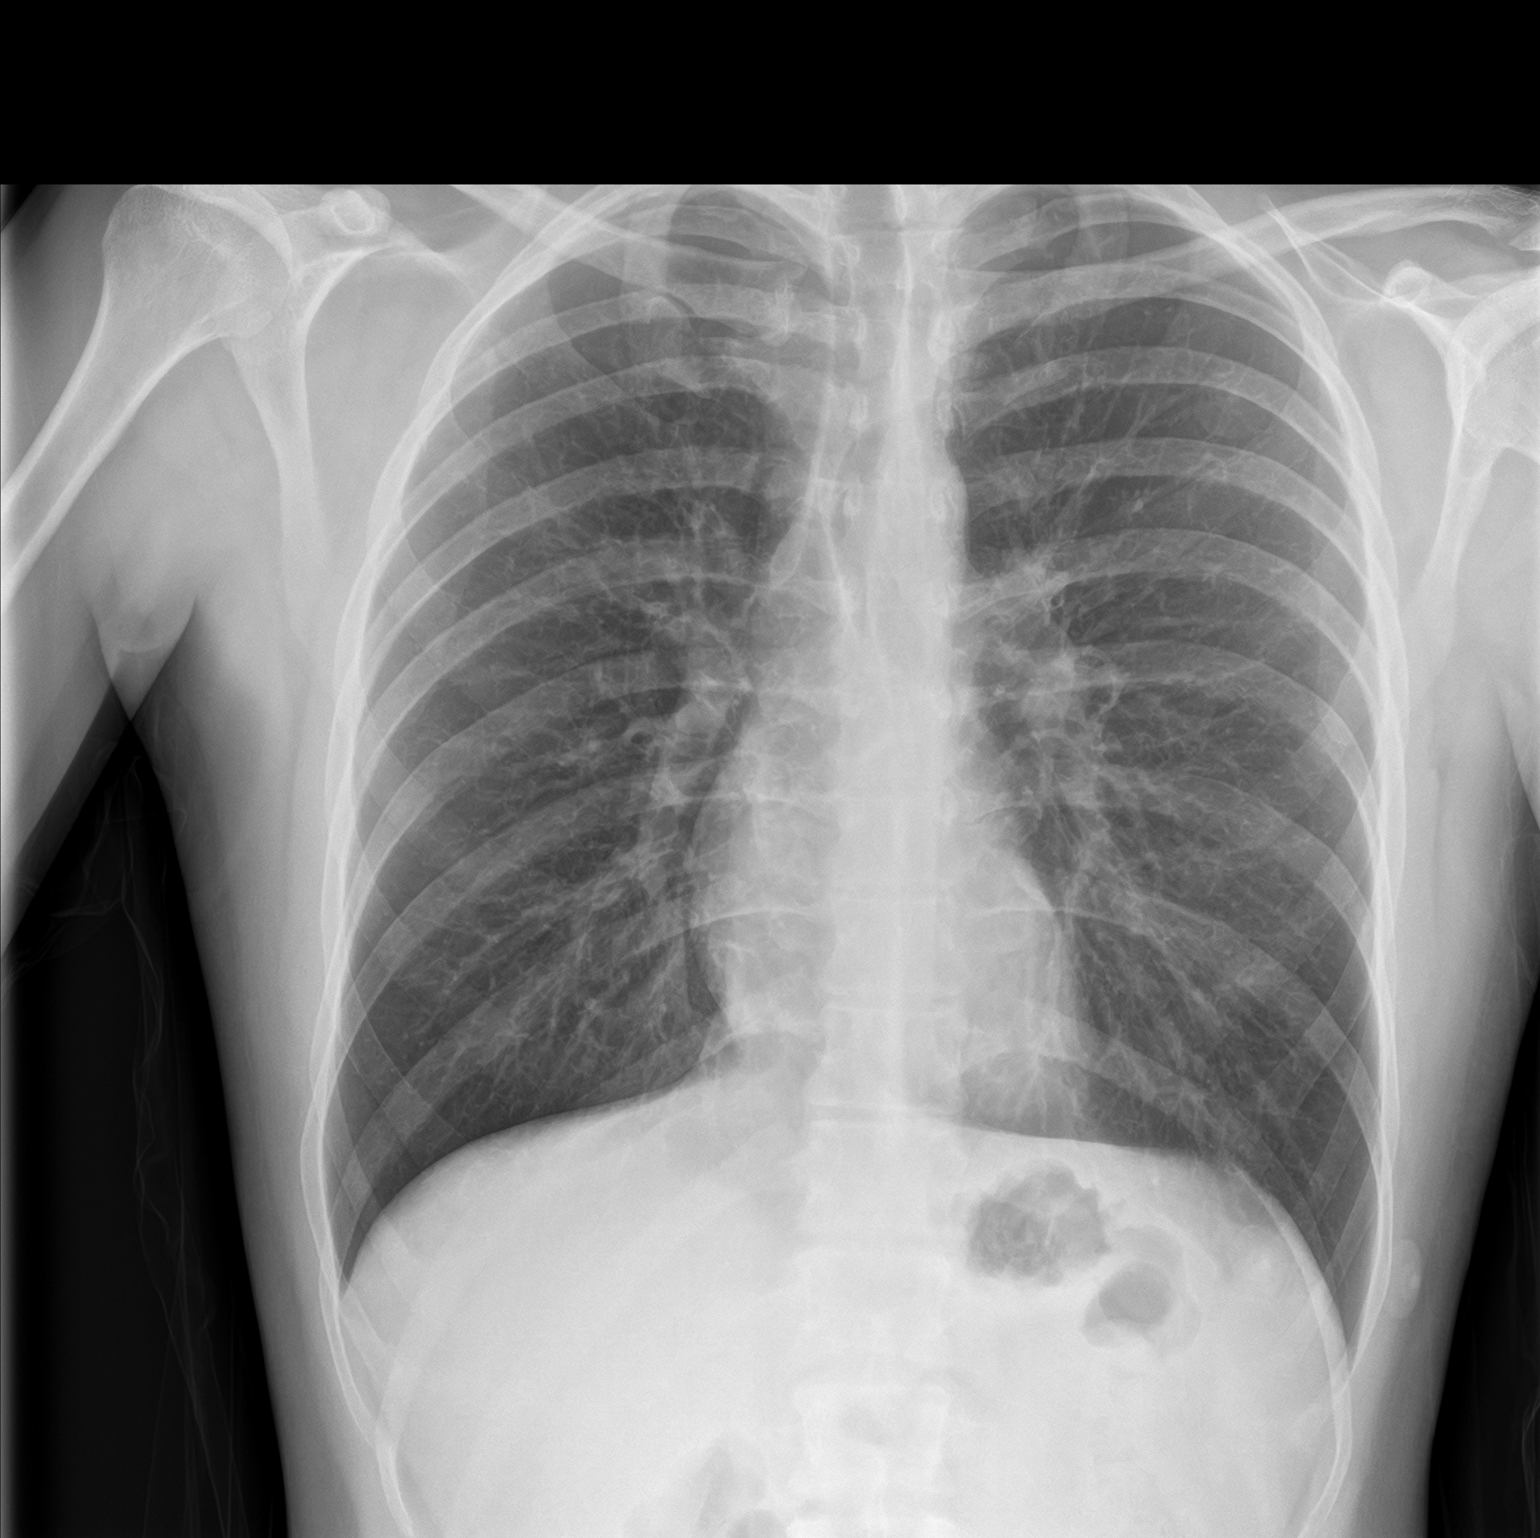
[im 2/2]
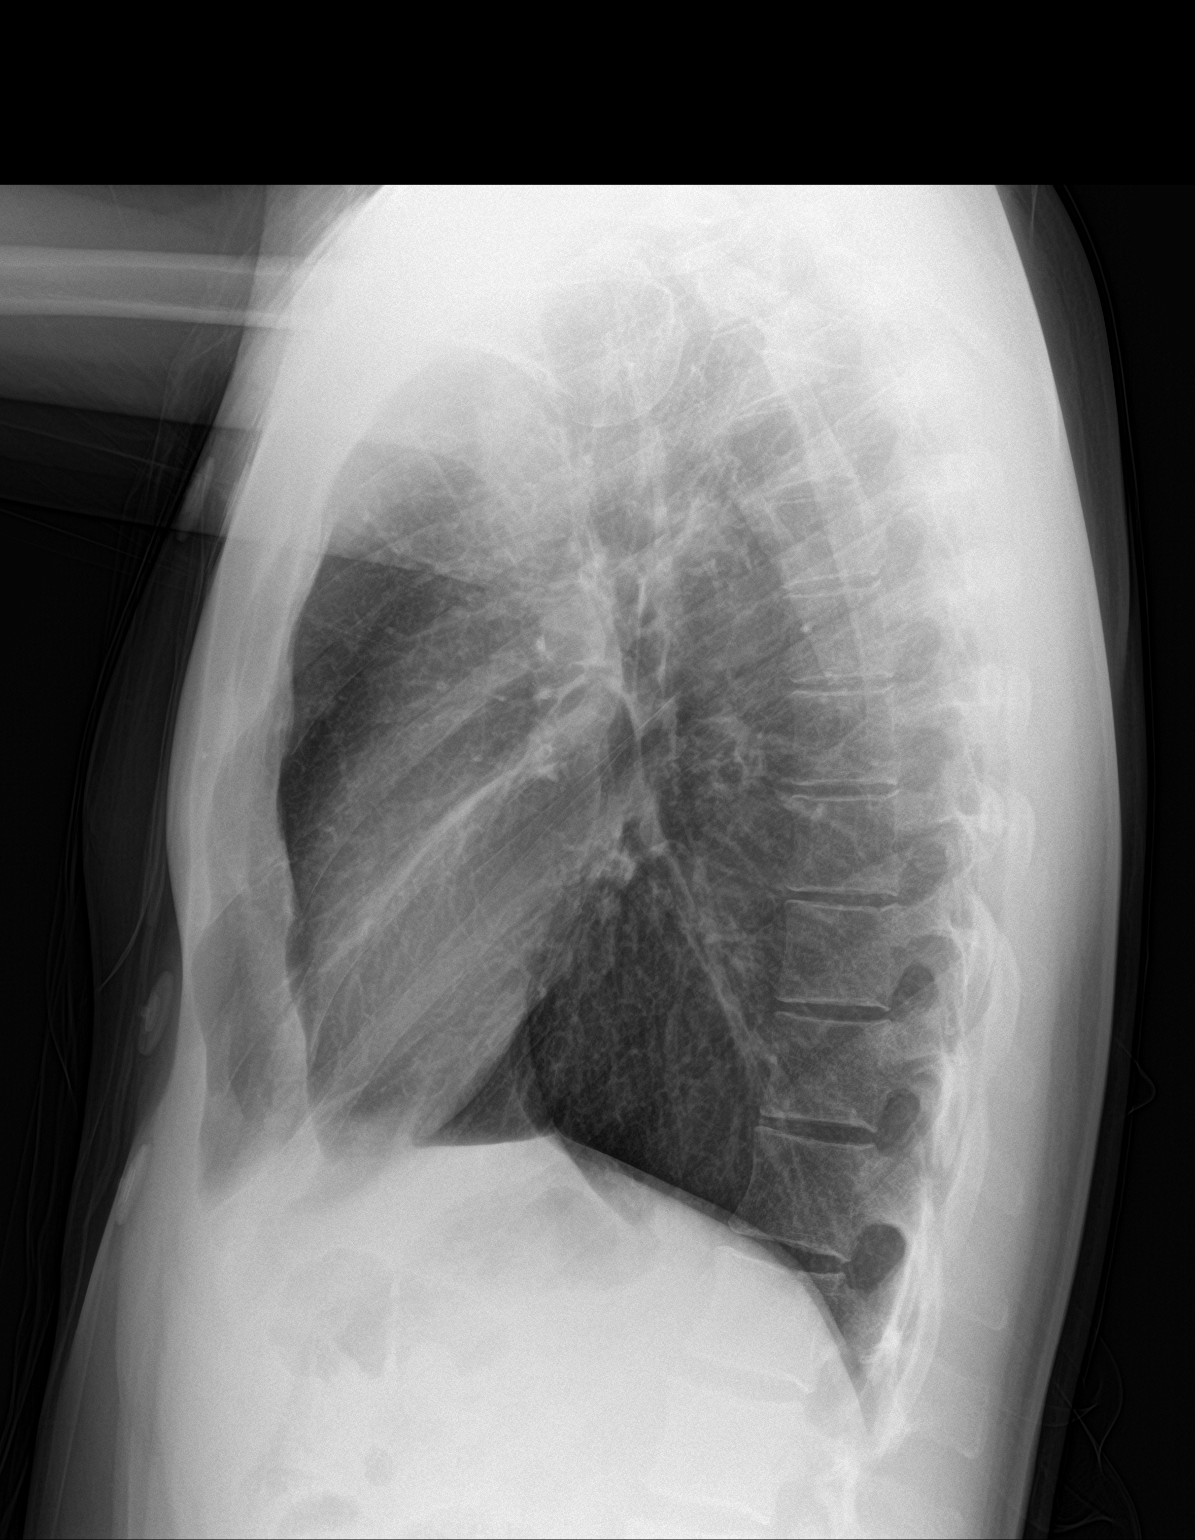

[2 of 2 positions shown; findings below may reference images not displayed]

FINDINGS: The heart size and mediastinal contours are within normal limits.
Both lungs are clear. The visualized skeletal structures are
unremarkable.
IMPRESSION: No active cardiopulmonary disease.

## 2021-11-23 IMAGING — CT CT HEAD W/O CM
4 series · 17 of 47 positions shown, 19 images · non-contrast
Comparison: None.

CLINICAL DATA: TIA versus CVA.

EXAM:
CT HEAD WITHOUT CONTRAST
TECHNIQUE: Contiguous axial images were obtained from the base of the skull
through the vertex without intravenous contrast.

[Series 2: head wo · axial · 0.42mm/px · z∈[+390,+495]mm · 7 of 29 slices shown, 9 images]
[im 4/29  brain]
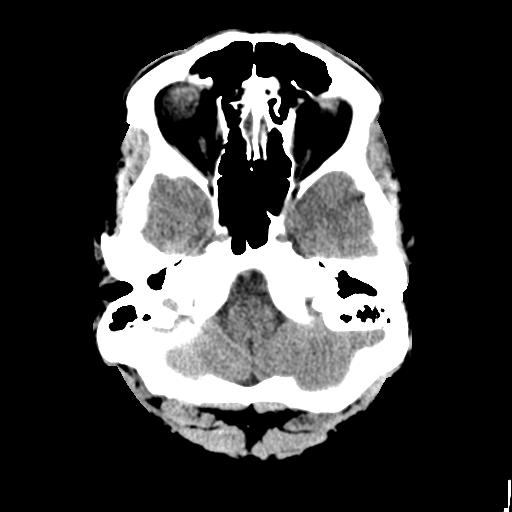
[im 4/29  bone]
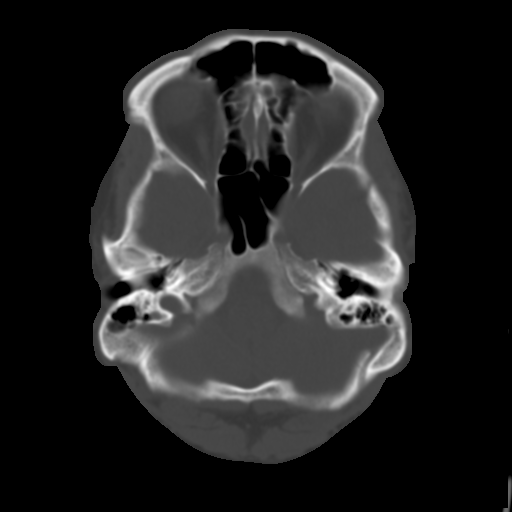
[im 8/29  brain]
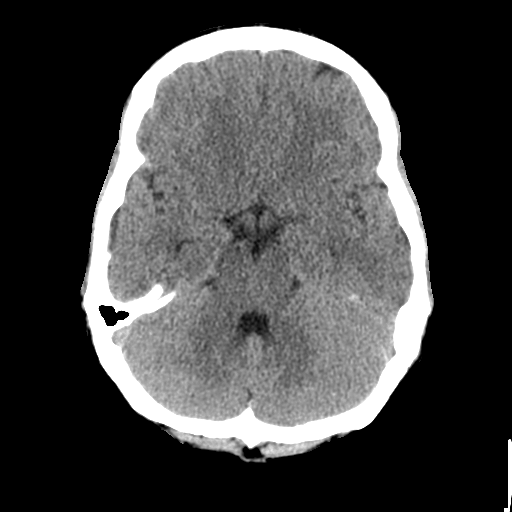
[im 11/29  brain]
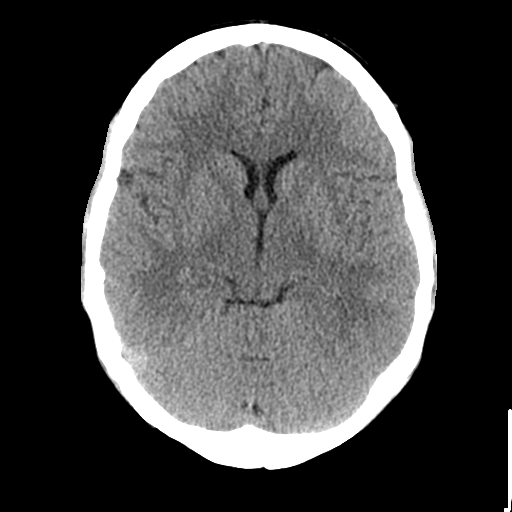
[im 15/29  brain]
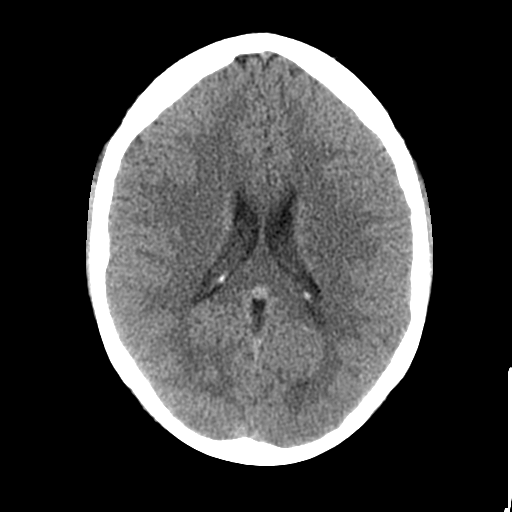
[im 18/29  brain]
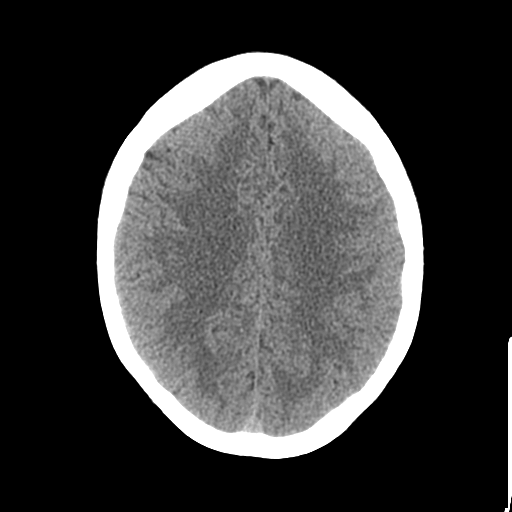
[im 18/29  bone]
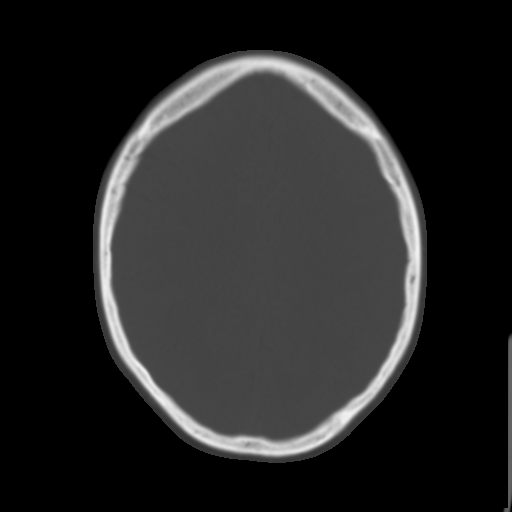
[im 22/29  brain]
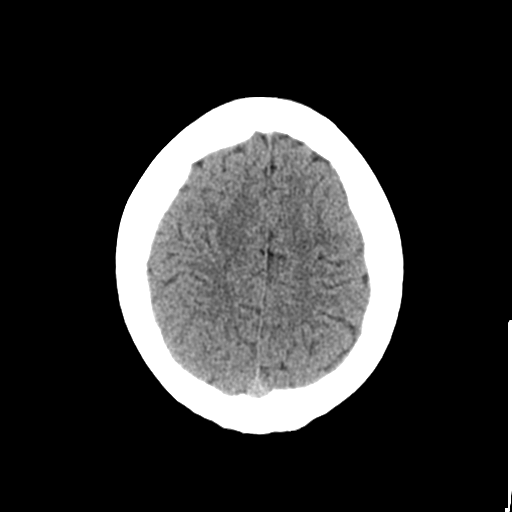
[im 25/29  brain]
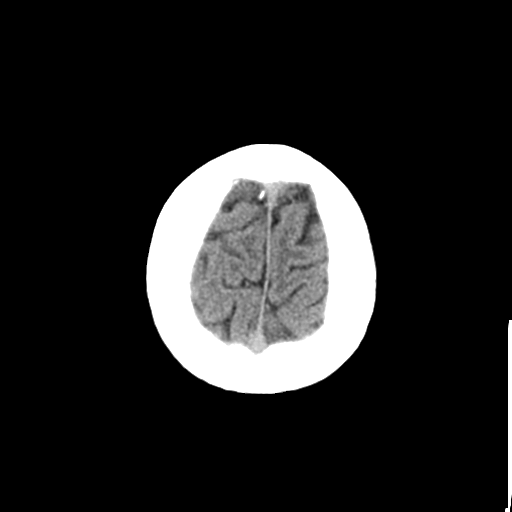

[Series 3: head bone · axial · 0.42mm/px · z∈[+389,+437]mm · 4 of 72 slices shown]
[im 8/72  bone]
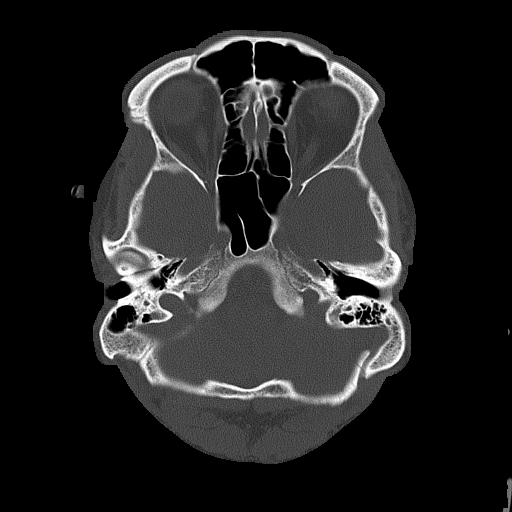
[im 15/72  bone]
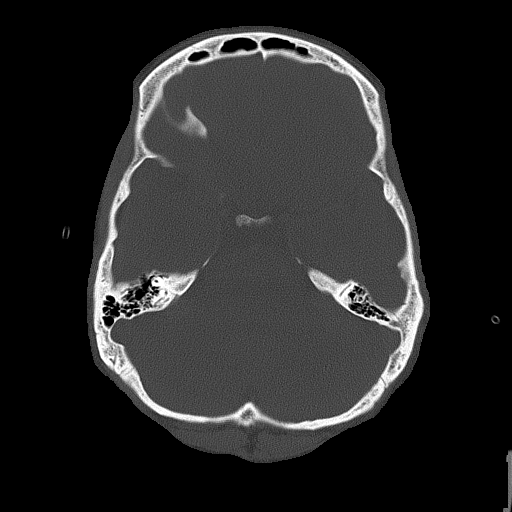
[im 22/72  bone]
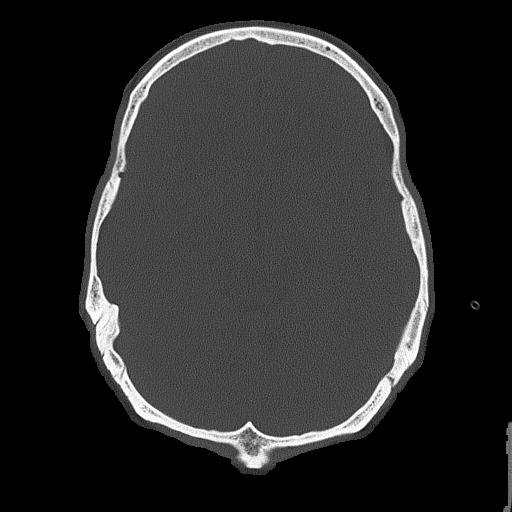
[im 32/72  bone]
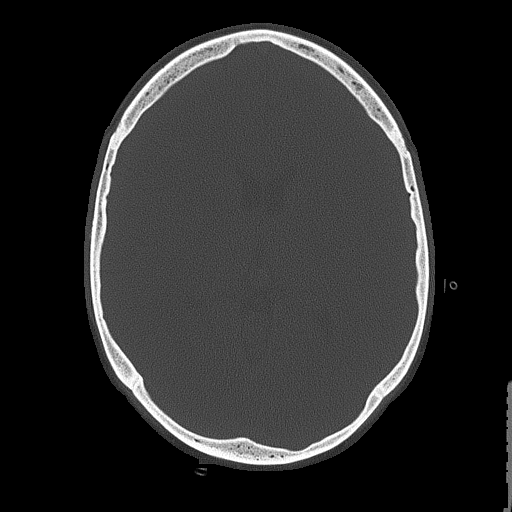

[Series 4: coronal soft tissue · coronal · 0.32mm/px · 3 of 63 slices shown]
[im 21/63  brain]
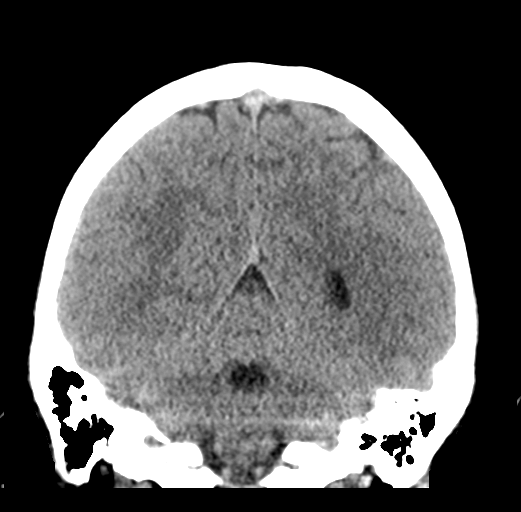
[im 28/63  brain]
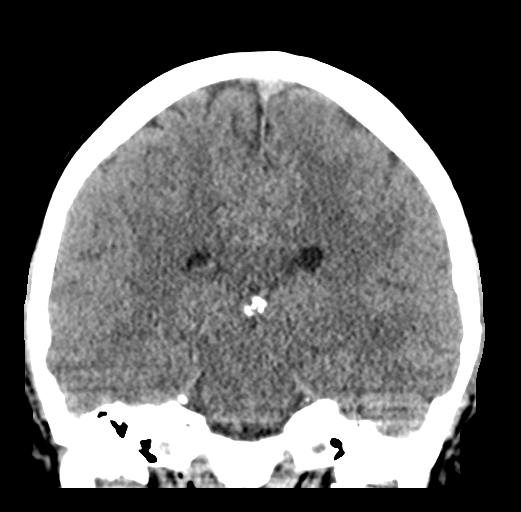
[im 35/63  brain]
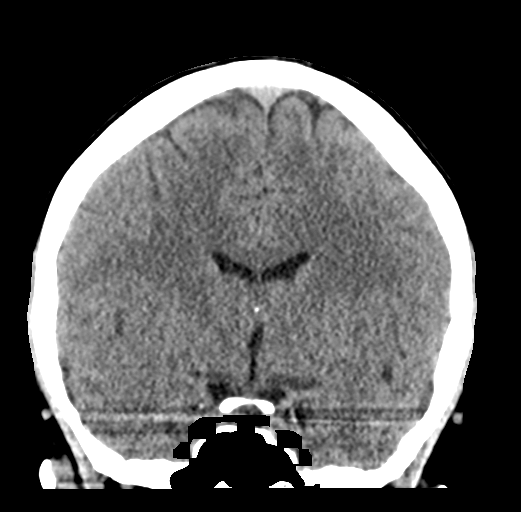

[Series 5: sagittal soft tissue · sagittal · 0.32mm/px · 3 of 53 slices shown]
[im 18/53  brain]
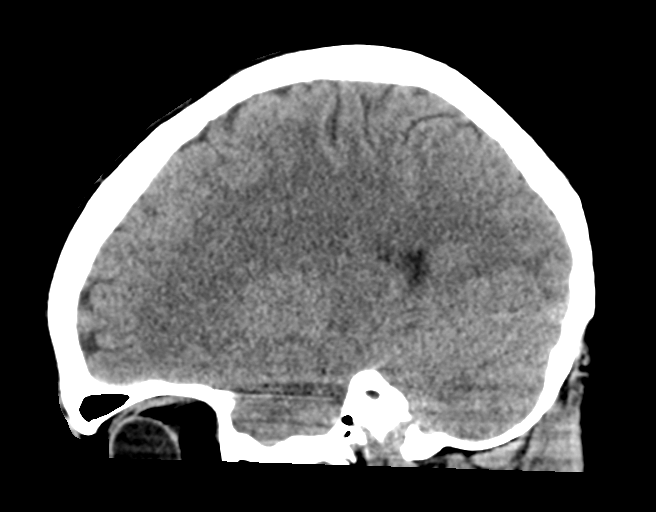
[im 27/53  brain]
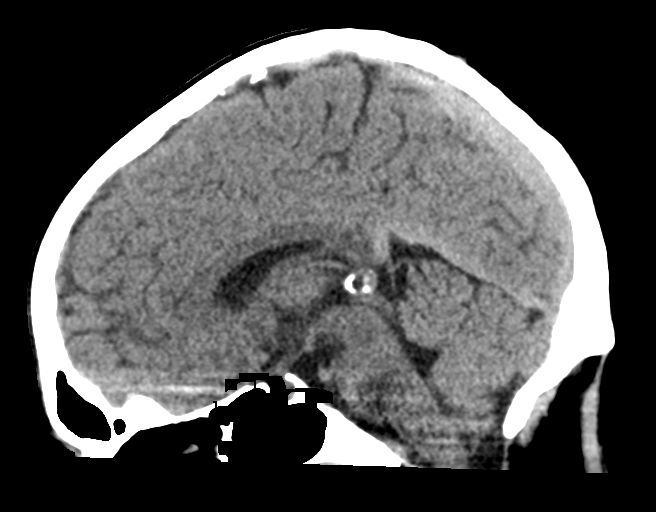
[im 35/53  brain]
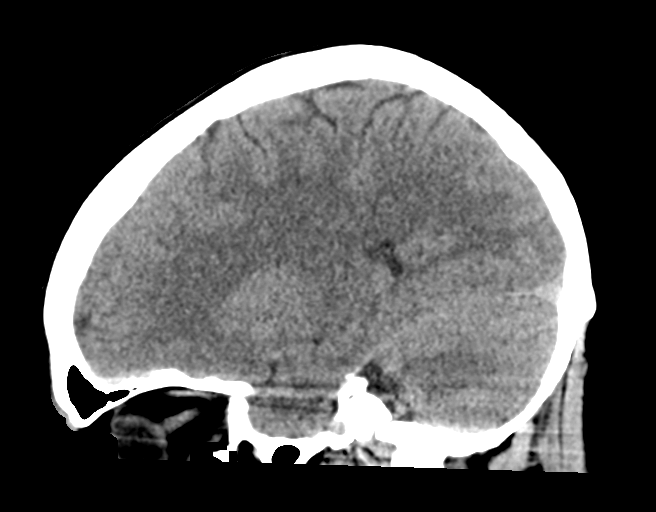

[17 of 47 positions shown; findings below may reference images not displayed]

FINDINGS: Brain: No evidence of acute large vascular territory infarction,
hemorrhage, hydrocephalus, extra-axial collection or mass
lesion/mass effect.

Vascular: No hyperdense vessel or unexpected calcification.

Skull: No acute fracture.

Sinuses/Orbits: Visualized sinuses are largely clear. Unremarkable
visualized orbits.

Other: No mastoid effusions.
IMPRESSION: No evidence of acute intracranial abnormality.

## 2021-12-07 ENCOUNTER — Ambulatory Visit (INDEPENDENT_AMBULATORY_CARE_PROVIDER_SITE_OTHER): Payer: Medicaid Other | Admitting: Podiatry

## 2021-12-07 DIAGNOSIS — L600 Ingrowing nail: Secondary | ICD-10-CM

## 2021-12-09 NOTE — Progress Notes (Signed)
   Chief Complaint  Patient presents with   nailcheck    Patient is here for nail check left foot great toe.    Subjective: 31 y.o. male presents today status post permanent nail avulsion procedure of the left great toe that was performed on 11/09/2021.  Patient states that overall there is some improvement.  He continues to have some sensitivity however.  He completed the oral antibiotics as prescribed and applies the gentamicin cream.  Presenting for follow-up treatment and evaluation.   No past medical history on file.  Objective: Neurovascular status intact.  Granular wound base along the nail plate.  No clinical indication of infection.  Routine healing noted along the nail avulsion matricectomy site  Assessment: #1 s/p toe permanent nail matrixectomy left great toe   Plan of care: #1 patient was evaluated  #2 light debridement of the periungual debris was performed to the border of the respective toe and nail plate using a tissue nipper. #3  Continue gentamicin cream daily  #4 patient is to return to clinic on a PRN basis.   Edrick Kins, DPM [facility]  Dr. Edrick Kins, DPM    [address], [place] [address]                Office [phone]  Fax [phone]

## 2022-01-21 ENCOUNTER — Encounter (INDEPENDENT_AMBULATORY_CARE_PROVIDER_SITE_OTHER): Payer: Self-pay

## 2022-09-29 ENCOUNTER — Ambulatory Visit
Admission: EM | Admit: 2022-09-29 | Discharge: 2022-09-29 | Disposition: A | Discharge status: Home / Self Care | Payer: Medicaid Other | Attending: Urgent Care | Admitting: Urgent Care

## 2022-09-29 DIAGNOSIS — K047 Periapical abscess without sinus: Secondary | ICD-10-CM | POA: Diagnosis not present

## 2022-09-29 MED ORDER — AMOXICILLIN-POT CLAVULANATE 875-125 MG PO TABS: 1.0000 | ORAL_TABLET | Freq: Two times a day (BID) | ORAL | 0 refills | Status: DC

## 2022-09-29 MED ORDER — ONDANSETRON 4 MG PO TBDP: 4.0000 mg | ORAL_TABLET | Freq: Three times a day (TID) | ORAL | 0 refills | Status: DC | PRN

## 2022-09-29 NOTE — Discharge Instructions (Addendum)
Antibiotic has been prescribed for a possible dental abscess.  This is only a temporary treatment.  Please schedule a visit with a dentist for evaluation as soon as possible for definitive treatment.  If you are unable to schedule an appointment with a dentist, please go to the emergency room for evaluation and imaging of your oral cavity.  Your symptoms of tingling in your arm need to be evaluated by an orthopedic provider.  I have provided contact information for [facility], a local orthopedic urgent care.  Alternatively, you may ask for a referral to a different orthopedic clinic from your primary care provider.

## 2022-09-29 NOTE — ED Provider Notes (Signed)
[name]    CSN: [id number] Arrival date & time: 09/29/22  1444      History   Chief Complaint Chief Complaint  Patient presents with   Dental Pain    HPI Hunter Kelly is a 32 y.o. male.    Dental Pain   Presents with complaint of right upper dental pain beginning 2 to 3 days ago.  He also complains of tingling in his arm.  Patient states he has history of similar symptoms which resolved spontaneously but left a "painful spot".  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  He states he is unable to see a dentist because they discharged him from service.  No past medical history on file.  Patient Active Problem List   Diagnosis Date Noted   Leg pain 10/31/2021   Respiratory failure (HCC) 03/08/2020   Acute respiratory failure with hypoxia (HCC) 03/07/2020    Past Surgical History:  Procedure Laterality Date   WISDOM TOOTH EXTRACTION         Home Medications    Prior to Admission medications   Medication Sig Start Date End Date Taking? Authorizing Provider  Buprenorphine HCl-Naloxone HCl 8-2 MG FILM Place 0.5 applicators under the tongue every 8 (eight) hours.    [provider]  doxycycline (VIBRA-TABS) 100 MG tablet Take 1 tablet (100 mg total) by mouth 2 (two) times daily. 11/09/21   Felecia Shelling, DPM  gentamicin cream (GARAMYCIN) 0.1 % Apply 1 Application topically 2 (two) times daily. 11/09/21   Felecia Shelling, DPM    Family History Family History  Problem Relation Age of Onset   Breast cancer Maternal Grandmother     Social History Social History   Tobacco Use   Smoking status: Every Day    Packs/day: 1    Types: Cigarettes   Smokeless tobacco: Never  Vaping Use   Vaping Use: Never used  Substance Use Topics   Alcohol use: No   Drug use: No     Allergies   Patient has no known allergies.   Review of Systems Review of Systems   Physical Exam Triage Vital  Signs ED Triage Vitals [09/29/22 1521]  Enc Vitals Group     BP      Pulse      Resp      Temp      Temp src      SpO2      Weight      Height      Head Circumference      Peak Flow      Pain Score 10     Pain Loc      Pain Edu?      Excl. in GC?    No data found.  Updated Vital Signs There were no vitals taken for this visit.  Visual Acuity Right Eye Distance:   Left Eye Distance:   Bilateral Distance:    Right Eye Near:   Left Eye Near:    Bilateral Near:     Physical Exam Vitals reviewed.  Constitutional:      Appearance: Normal appearance.  HENT:     Mouth/Throat:   Skin:    General: Skin is warm and dry.  Neurological:     General: No focal deficit present.     Mental Status: He is alert and oriented to person, place, and time.  Psychiatric:  Mood and Affect: Mood normal.        Behavior: Behavior normal.      UC Treatments / Results  Labs (all labs ordered are listed, but only abnormal results are displayed) Labs Reviewed - No data to display  EKG   Radiology No results found.  Procedures Procedures (including critical care time)  Medications Ordered in UC Medications - No data to display  Initial Impression / Assessment and Plan / UC Course  I have reviewed the triage vital signs and the nursing notes.  Pertinent labs & imaging results that were available during my care of the patient were reviewed by me and considered in my medical decision making (see chart for details).   Erythema and swelling of the hard palate, right side of his mouth.  Possible abscess secondary to caries.  Will prescribe Augmentin however recommending patient have immediate evaluation by a dental provider.  Advised patient to go to the ED if he is not able to get in with a dental provider due to his past medical history.  Counseled patient on potential for adverse effects with medications prescribed/recommended today, ER and return-to-clinic precautions  discussed, patient verbalized understanding and agreement with care plan.  Final Clinical Impressions(s) / UC Diagnoses   Final diagnoses:  None   Discharge Instructions   None    ED Prescriptions   None    PDMP not reviewed this encounter.   Charma Igo, [place] 09/29/22 1535

## 2022-09-29 NOTE — ED Triage Notes (Signed)
Patient c/o right upper dental pain that began about 2-3 days ago.  Home interventions: tylenol

## 2022-09-30 ENCOUNTER — Other Ambulatory Visit: Payer: Self-pay

## 2022-09-30 DIAGNOSIS — K122 Cellulitis and abscess of mouth: Principal | ICD-10-CM | POA: Insufficient documentation

## 2022-09-30 DIAGNOSIS — K0530 Chronic periodontitis, unspecified: Secondary | ICD-10-CM | POA: Insufficient documentation

## 2022-09-30 DIAGNOSIS — K046 Periapical abscess with sinus: Secondary | ICD-10-CM | POA: Diagnosis not present

## 2022-09-30 DIAGNOSIS — R252 Cramp and spasm: Secondary | ICD-10-CM | POA: Insufficient documentation

## 2022-09-30 DIAGNOSIS — F17210 Nicotine dependence, cigarettes, uncomplicated: Secondary | ICD-10-CM | POA: Diagnosis not present

## 2022-09-30 DIAGNOSIS — R220 Localized swelling, mass and lump, head: Secondary | ICD-10-CM | POA: Diagnosis present

## 2022-09-30 NOTE — ED Triage Notes (Signed)
Pt presents to ER with c/o poss abscess to roof of his mouth that he states started on 7/4, but has become markedly worse in last 2 hours.  Pt reports he has felt an increased diff breathing and swallowing.  Pt also reports he started running a fever yesterday.  Pt speaking clearly without diff in triage and is otherwise A&O x4 and in NAD.

## 2022-10-01 ENCOUNTER — Encounter: Payer: Self-pay | Admitting: Internal Medicine

## 2022-10-01 ENCOUNTER — Observation Stay
Admission: EM | Admit: 2022-10-01 | Discharge: 2022-10-01 | Disposition: A | Discharge status: Home / Self Care | Payer: Medicaid Other | Attending: Internal Medicine | Admitting: Internal Medicine

## 2022-10-01 ENCOUNTER — Emergency Department: Payer: Medicaid Other

## 2022-10-01 DIAGNOSIS — K0530 Chronic periodontitis, unspecified: Secondary | ICD-10-CM

## 2022-10-01 DIAGNOSIS — K046 Periapical abscess with sinus: Secondary | ICD-10-CM

## 2022-10-01 DIAGNOSIS — F1120 Opioid dependence, uncomplicated: Secondary | ICD-10-CM | POA: Insufficient documentation

## 2022-10-01 DIAGNOSIS — R252 Cramp and spasm: Secondary | ICD-10-CM

## 2022-10-01 DIAGNOSIS — K122 Cellulitis and abscess of mouth: Secondary | ICD-10-CM | POA: Diagnosis present

## 2022-10-01 DIAGNOSIS — K047 Periapical abscess without sinus: Secondary | ICD-10-CM | POA: Diagnosis not present

## 2022-10-01 LAB — CBC WITH DIFFERENTIAL/PLATELET
Abs Immature Granulocytes: 0.03 10*3/uL (ref 0.00–0.07)
Basophils Absolute: 0.1 10*3/uL (ref 0.0–0.1)
Basophils Relative: 1 %
Eosinophils Absolute: 0.5 10*3/uL (ref 0.0–0.5)
Eosinophils Relative: 4 %
HCT: 40.8 % (ref 39.0–52.0)
Hemoglobin: 13.3 g/dL (ref 13.0–17.0)
Immature Granulocytes: 0 %
Lymphocytes Relative: 27 %
Lymphs Abs: 3 10*3/uL (ref 0.7–4.0)
MCH: 28.4 pg (ref 26.0–34.0)
MCHC: 32.6 g/dL (ref 30.0–36.0)
MCV: 87 fL (ref 80.0–100.0)
Monocytes Absolute: 1 10*3/uL (ref 0.1–1.0)
Monocytes Relative: 9 %
Neutro Abs: 6.5 10*3/uL (ref 1.7–7.7)
Neutrophils Relative %: 59 %
Platelets: 270 10*3/uL (ref 150–400)
RBC: 4.69 MIL/uL (ref 4.22–5.81)
RDW: 13 % (ref 11.5–15.5)
WBC: 11.1 10*3/uL — ABNORMAL HIGH (ref 4.0–10.5)
nRBC: 0 % (ref 0.0–0.2)

## 2022-10-01 LAB — BASIC METABOLIC PANEL
Anion gap: 10 (ref 5–15)
BUN: 9 mg/dL (ref 6–20)
CO2: 25 mmol/L (ref 22–32)
Calcium: 8.9 mg/dL (ref 8.9–10.3)
Chloride: 102 mmol/L (ref 98–111)
Creatinine, Ser: 0.66 mg/dL (ref 0.61–1.24)
GFR, Estimated: >60 mL/min (ref >60)
Glucose, Bld: 105 mg/dL — ABNORMAL HIGH (ref 70–99)
Potassium: 3.5 mmol/L (ref 3.5–5.1)
Sodium: 137 mmol/L (ref 135–145)

## 2022-10-01 LAB — HIV ANTIBODY (ROUTINE TESTING W REFLEX): HIV Screen 4th Generation wRfx: NONREACTIVE

## 2022-10-01 MED ORDER — DEXAMETHASONE SODIUM PHOSPHATE 10 MG/ML IJ SOLN
10.0000 mg | Freq: Once | INTRAMUSCULAR | Status: AC
Start: 2022-10-01 — End: 2022-10-01
  Administered 2022-10-01: 10 mg via INTRAVENOUS
  Filled 2022-10-01: qty 1

## 2022-10-01 MED ORDER — KETOROLAC TROMETHAMINE 30 MG/ML IJ SOLN
15.0000 mg | Freq: Once | INTRAMUSCULAR | Status: AC
  Administered 2022-10-01: 15 mg via INTRAVENOUS
  Filled 2022-10-01: qty 1

## 2022-10-01 MED ORDER — IOHEXOL 300 MG/ML  SOLN
75.0000 mL | Freq: Once | INTRAMUSCULAR | Status: AC | PRN
  Administered 2022-10-01: 75 mL via INTRAVENOUS

## 2022-10-01 MED ORDER — BUPRENORPHINE HCL-NALOXONE HCL 8-2 MG SL SUBL
1.0000 | SUBLINGUAL_TABLET | Freq: Two times a day (BID) | SUBLINGUAL | Status: DC
  Administered 2022-10-01: 0.5 via SUBLINGUAL
  Filled 2022-10-01: qty 1

## 2022-10-01 MED ORDER — ONDANSETRON HCL 4 MG PO TABS: 4.0000 mg | ORAL_TABLET | Freq: Four times a day (QID) | ORAL | Status: DC | PRN

## 2022-10-01 MED ORDER — ONDANSETRON 4 MG PO TBDP
4.00 mg | ORAL_TABLET | Freq: Three times a day (TID) | ORAL | 0 refills | Status: AC | PRN
Start: 2022-10-01 — End: ?

## 2022-10-01 MED ORDER — BUPRENORPHINE HCL-NALOXONE HCL 8-2 MG SL SUBL
0.5000 | SUBLINGUAL_TABLET | Freq: Four times a day (QID) | SUBLINGUAL | Status: DC
  Administered 2022-10-01: 0.5 via SUBLINGUAL
  Filled 2022-10-01: qty 1

## 2022-10-01 MED ORDER — SODIUM CHLORIDE 0.9 % IV SOLN
3.0000 g | INTRAVENOUS | Status: AC
  Administered 2022-10-01: 3 g via INTRAVENOUS
  Filled 2022-10-01: qty 8

## 2022-10-01 MED ORDER — SODIUM CHLORIDE 0.9 % IV SOLN
3.0000 g | Freq: Four times a day (QID) | INTRAVENOUS | Status: DC
  Administered 2022-10-01 (×2): 3 g via INTRAVENOUS
  Filled 2022-10-01 (×2): qty 8

## 2022-10-01 MED ORDER — BUPRENORPHINE HCL-NALOXONE HCL 8-2 MG SL SUBL: 1.0000 | SUBLINGUAL_TABLET | Freq: Every day | SUBLINGUAL | Status: DC

## 2022-10-01 MED ORDER — PREDNISONE 10 MG PO TABS: ORAL_TABLET | ORAL | 0 refills | Status: AC

## 2022-10-01 MED ORDER — LIDOCAINE VISCOUS HCL 2 % MT SOLN
15.0000 mL | Freq: Once | OROMUCOSAL | Status: AC
  Administered 2022-10-01: 15 mL via OROMUCOSAL
  Filled 2022-10-01: qty 15

## 2022-10-01 MED ORDER — ACETAMINOPHEN 325 MG RE SUPP: 650.0000 mg | Freq: Four times a day (QID) | RECTAL | Status: DC | PRN

## 2022-10-01 MED ORDER — SODIUM CHLORIDE 0.9 % IV SOLN: 3.0000 g | Freq: Four times a day (QID) | INTRAVENOUS | Status: DC

## 2022-10-01 MED ORDER — ACETAMINOPHEN 325 MG PO TABS: 650.0000 mg | ORAL_TABLET | Freq: Four times a day (QID) | ORAL | Status: DC | PRN

## 2022-10-01 MED ORDER — AMOXICILLIN-POT CLAVULANATE 875-125 MG PO TABS
1.0000 | ORAL_TABLET | Freq: Two times a day (BID) | ORAL | 0 refills | Status: AC
Start: 2022-10-01 — End: 2022-10-14

## 2022-10-01 MED ORDER — ACETAMINOPHEN 500 MG PO TABS: 1000.0000 mg | ORAL_TABLET | Freq: Three times a day (TID) | ORAL | Status: AC | PRN

## 2022-10-01 MED ORDER — BENZOCAINE 20 % MT SOLN
1.0000 | Freq: Once | OROMUCOSAL | Status: AC
  Administered 2022-10-01: 1 via OROMUCOSAL
  Filled 2022-10-01: qty 1

## 2022-10-01 MED ORDER — ONDANSETRON HCL 4 MG/2ML IJ SOLN: 4.0000 mg | Freq: Four times a day (QID) | INTRAMUSCULAR | Status: DC | PRN

## 2022-10-01 MED ORDER — CHLORHEXIDINE GLUCONATE 0.12 % MT SOLN: 15.0000 mL | Freq: Four times a day (QID) | OROMUCOSAL | Status: DC

## 2022-10-01 MED ORDER — SODIUM CHLORIDE 0.9 % IV BOLUS
1000.0000 mL | Freq: Once | INTRAVENOUS | Status: AC
  Administered 2022-10-01: 1000 mL via INTRAVENOUS

## 2022-10-01 NOTE — Progress Notes (Signed)
Pharmacy Antibiotic Note  Hunter Kelly is a 32 y.o. male admitted on 10/01/2022 with oral abscess.  Pharmacy has been consulted for Unasyn dosing.  Plan: Unasyn 3 gm q6hr per indication & renal fxn.  Pharmacy will continue to follow and will adjust abx dosing whenever warranted.  Temp (24hrs), Avg:98.3 F (36.8 C), Min:98.3 F (36.8 C), Max:98.3 F (36.8 C)   Recent Labs  Lab 10/01/22 0001  WBC 11.1*  CREATININE 0.66    Estimated Creatinine Clearance: 126.8 mL/min (by C-G formula based on SCr of 0.66 mg/dL).    No Known Allergies  Antimicrobials this admission: 7/09 Unasyn >>   Microbiology results: 7/09 WoundCx: Pending  Thank you for allowing pharmacy to be a part of this patient's care.  Otelia Sergeant, PharmD, [facility] 10/01/2022 5:09 AM

## 2022-10-01 NOTE — ED Notes (Signed)
Hospitalist at bedside 

## 2022-10-01 NOTE — ED Notes (Signed)
Patient resting quietly with eyes closed.  Respirations even and unlabored.  °

## 2022-10-01 NOTE — Assessment & Plan Note (Addendum)
Poor dentition with periapical abscesses S/p I&D of abscess in the ED I reached out to ENT who reached out to the oral surgeon.  ENT recommended discharging with follow-up with oral surgeon as outpatient.  The patient wanted to go home.  I will refer to oral surgeon as outpatient.  Will prescribe Augmentin and prednisone taper.  Likely will need quite a few of his teeth removed and potentially abscess drained.

## 2022-10-01 NOTE — Assessment & Plan Note (Addendum)
Continue sublingual Suboxone.  Patient denies any recent drug abuse.

## 2022-10-01 NOTE — ED Provider Notes (Signed)
[facility] Provider Note    Event Date/Time   First MD Initiated Contact with Patient 10/01/22 [phone]     (approximate)   History   Oral Swelling   HPI Hunter Kelly is a 32 y.o. 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  He presents for relatively rapidly worsening swelling in the roof of his mouth.  He was seen several days ago when he started to have some pain and swelling.  He was started on Augmentin and recommended that he see a dentist soon as possible, but unfortunately because he has a sensitive stomach to antibiotics and because he has not been able to eat or drink much of anything recently, he has not been able to tolerate any of the antibiotics, may be only a single dose and he vomited after taking it.  He said the swelling and pain on the right front part of the roof of his mouth has gotten worse and now it is hard to open his mouth wide.  The swelling is obvious on the outside of his face as well.  No fever or chills in the emergency department, but he says that he had a fever yesterday at home.  No nausea or vomiting.  He said he is afraid that it is hard to breathe or swallow but he is not struggling with his secretions.     Physical Exam   Triage Vital Signs: ED Triage Vitals [09/30/22 2358]  Enc Vitals Group     BP (!) 140/92     Pulse Rate 80     Resp 20     Temp 98.3 F (36.8 C)     Temp Source Oral     SpO2 98 %     Weight 65.8 kg (145 lb)     Height 1.727 m (5\' 8" )     Head Circumference      Peak Flow      Pain Score 9     Pain Loc      Pain Edu?      Excl. in GC?     Most recent vital signs: Vitals:   09/30/22 2358 10/01/22 0335  BP: (!) 140/92 (!) 151/94  Pulse: 80 73  Resp: 20 18  Temp: 98.3 F (36.8 C)   SpO2: 98% 99%    General: Awake, no distress though he appears uncomfortable. CV:  Good peripheral perfusion.  Regular  rate and rhythm. Resp:  Normal effort. Speaking easily and comfortably, no accessory muscle usage nor intercostal retractions.   Abd:  No distention.  HEENT:           Mild trismus.  Chronically poor dentition with a large, very obvious abscess in the anterior right side of the roof of his mouth as seen in the photo below.  No evidence of posterior oropharynx issues.     ED Results / Procedures / Treatments   Labs (all labs ordered are listed, but only abnormal results are displayed) Labs Reviewed  BASIC METABOLIC PANEL - Abnormal; Notable for the following components:      Result Value   Glucose, Bld 105 (*)    All other components within normal limits  CBC WITH DIFFERENTIAL/PLATELET - Abnormal; Notable for the following components:   WBC 11.1 (*)    All other components within normal limits  AEROBIC/ANAEROBIC CULTURE W GRAM STAIN (SURGICAL/DEEP WOUND)  RADIOLOGY I viewed and interpreted the patient's CT soft tissue neck with IV contrast.  He has an obvious abscess measuring about 1.7 cm in diameter in the roof of his mouth as seen in the photo above.  No evidence of deep neck space infection.   PROCEDURES:  Critical Care performed: No  ..Incision and Drainage  Date/Time: 10/01/2022 4:03 AM  Performed by: Loleta Rose, MD Authorized by: Loleta Rose, MD   Consent:    Consent obtained:  Emergent situation and written   Consent given by:  Patient   Risks discussed:  Bleeding, infection, incomplete drainage and pain   Alternatives discussed:  Alternative treatment, delayed treatment and observation Universal protocol:    Patient identity confirmed:  Verbally with patient and arm band Location:    Type:  Abscess   Location:  Mouth   Mouth location:  Palate Anesthesia:    Anesthesia method:  Topical application   Topical anesthesia: Benzocaine spray, viscous lidocaine. Procedure type:    Complexity:  Simple Procedure details:    Incision types:  Single straight  and stab incision   Drainage:  Bloody and purulent   Drainage amount:  Copious (copious blood, moderate purulence)   Wound treatment:  Wound left open   Packing materials:  None Post-procedure details:    Procedure completion:  Tolerated well, no immediate complications     IMPRESSION / MDM / ASSESSMENT AND PLAN / ED COURSE  I reviewed the triage vital signs and the nursing notes.                              Differential diagnosis includes, but is not limited to, odontogenic infection with abscess, facial cellulitis, deep neck space infection, retropharyngeal abscess, peritonsillar abscess, Ludwig's angina.  Patient's presentation is most consistent with acute presentation with potential threat to life or bodily function.  Labs/studies ordered: CT soft tissue neck with contrast, wound culture, basic metabolic panel, CBC with differential  Interventions/Medications given:  Medications  sodium chloride 0.9 % bolus 1,000 mL (has no administration in time range)  chlorhexidine (PERIDEX) 0.12 % solution 15 mL (has no administration in time range)  iohexol (OMNIPAQUE) 300 MG/ML solution 75 mL (75 mLs Intravenous Contrast Given 10/01/22 0047)  benzocaine (HURRICAINE) 20 % mouth spray 1 Application (1 Application Mouth/Throat Given 10/01/22 0424)  ketorolac (TORADOL) 30 MG/ML injection 15 mg (15 mg Intravenous Given 10/01/22 0400)  Ampicillin-Sulbactam (UNASYN) 3 g in sodium chloride 0.9 % 100 mL IVPB (3 g Intravenous New Bag/Given 10/01/22 0405)  dexamethasone (DECADRON) injection 10 mg (10 mg Intravenous Given 10/01/22 0402)  lidocaine (XYLOCAINE) 2 % viscous mouth solution 15 mL (15 mLs Mouth/Throat Given 10/01/22 0407)    (Note:  hospital course my include additional interventions and/or labs/studies not listed above.)   Evaluation is very consistent with odontogenic infection that is developed into an abscess and somewhat of an unusual location on the roof of his mouth.  I ordered CT scan of  the neck to rule out a deep neck space infection unfortunately that is not present but the patient does have a oral abscess in the roof of the mouth coming from a peritonsillar abscess.  I will discuss the case with the ENT but I anticipate the need for IV antibiotics given failure of outpatient treatment, Decadron to help with the swelling, and incision and drainage of the abscess in the roof of his mouth if possible and if recommended by  ENT.  Given the location I am comfortable draining it myself rather than requiring ENT intervention but again we will discuss this with ENT.  Anticipate admission to the hospitalist service.     Clinical Course as of 10/01/22 0439  Tue Oct 01, 2022  0354 Consulted by phone with Dr. Jenne Campus with ENT.  He agreed with my plan for Unasyn 3 g IV, Decadron 10 mg IV, and I&D of the abscess on the roof of the mouth, as well as for the plan for hospitalization to the hospitalist service.  He agreed with the wound culture.  He pointed out that we now have OMFS specialist Dr. Leretha Dykes who sees patients in the hospital and that the hospitalist can consult Dr. Metta Clines for inpatient evaluation.   [CF]  [phone] Consulted Dr. Para March with the hospitalist service who will admit the patient and who will consult Dr. Metta Clines for later today. [CF]  [phone] Patient tolerated I&D well.  Ordered Peridex oral rinse.  Dr. Para March with the hospitalist service saw the patient prior to the I&D and was present for the procedure.  She will admit the patient. [CF]    Clinical Course User Index [CF] Loleta Rose, MD     FINAL CLINICAL IMPRESSION(S) / ED DIAGNOSES   Final diagnoses:  Abscess of soft tissue of mouth  Periodontitis  Periapical abscess with sinus tract  Trismus     Rx / DC Orders   ED Discharge Orders     None        Note:  This document was prepared using Dragon voice recognition software and may include unintentional dictation errors.   Loleta Rose, MD 10/01/22  [phone]

## 2022-10-01 NOTE — ED Notes (Signed)
Dr Renae Gloss with pt and family now.

## 2022-10-01 NOTE — ED Notes (Signed)
Pt eating dinner meal.  Iv d'ced   d/c inst to pt.

## 2022-10-01 NOTE — Hospital Course (Signed)
32 y.o. male with medical history significant for With poor dentition resulting in narcotic dependence but now on Suboxone who presents to the ED with a painful swelling in the roof of his mouth that appeared several days prior.  He was started on Augmentin but only took 1 dose.  The swelling got progressively more painful prompting the visit to the ED.  He denies fever or chills.  He is able to swallow his secretions. ED course and data review: BP 140/92 with otherwise normal vitals.  Labs with WBC 11,000 and otherwise unremarkable. CT soft tissue neck shows an abscess extending from the right maxillary canine and other findings as follows: IMPRESSION: 1. Odontogenic abscess extending from the apical periodontitis at the root of the right maxillary canine into the roof of the mouth, measuring up to 17 mm. 2. Additional periapical lucency about the root of the right maxillary central incisor, without definite adjacent collection, although evaluation is limited by beam hardening from the patient's dental hardware. 3. Prominent right neck lymph nodes, which are favored to be reactive. 4. Mucosal thickening in the right maxillary sinus, without evidence of dehiscence of the floor of the maxilla, although there is thinning about the right maxillary canine root.   Patient treated with an IV fluid bolus, Decadron and Unasyn.  The ED provider spoke with ENT, Dr. Jenne Campus who recommended I&D in the ED, medical consult and consulting new oral surgeon, Dr. Metta Clines.  Patient underwent I&D with topical anesthesia in the ED. Hospitalist consulted for admission   7/9.  Case discussed with Dr. Jenne Campus and he texted Dr. Metta Clines.  Patient wanted to follow-up as outpatient, so he was discharged home on Augmentin and prednisone taper.

## 2022-10-01 NOTE — ED Notes (Signed)
Pt in hallway bed.  Meds given  pt on cell phone.  Pt waiting for admission.

## 2022-10-01 NOTE — ED Notes (Signed)
Hospitalist will come talk again with pt.

## 2022-10-01 NOTE — ED Notes (Signed)
Pt asking to know "the plan" for oral surgery. Pt has not seen oral surgeon yet and now has diet ordered. Pt says he would rather discharge home today after next IV abx dose if surgery not happening today/tom. Messaged hospitalist.

## 2022-10-01 NOTE — ED Notes (Signed)
Spoke with pharmacist to have suboxone dosage adjusted. Pt takes half tab not full tab.

## 2022-10-01 NOTE — H&P (Signed)
History and Physical    Patient: Hunter Kelly ZOX:[id number] DOB: November 11, 1990 DOA: 10/01/2022 DOS: the patient was seen and examined on 10/01/2022 PCP: [facility]  Patient coming from: Home  Chief Complaint:  Chief Complaint  Patient presents with   Oral Swelling    HPI: BECKET WECKER is a 32 y.o. male with medical history significant for With poor dentition resulting in narcotic dependence but now on Suboxone who presents to the ED with a painful swelling in the roof of his mouth that appeared several days prior.  He was started on Augmentin but only took 1 dose.  The swelling got progressively more painful prompting the visit to the ED.  He denies fever or chills.  He is able to swallow his secretions. ED course and data review: BP 140/92 with otherwise normal vitals.  Labs with WBC 11,000 and otherwise unremarkable. CT soft tissue neck shows an abscess extending from the right maxillary canine and other findings as follows: IMPRESSION: 1. Odontogenic abscess extending from the apical periodontitis at the root of the right maxillary canine into the roof of the mouth, measuring up to 17 mm. 2. Additional periapical lucency about the root of the right maxillary central incisor, without definite adjacent collection, although evaluation is limited by beam hardening from the patient's dental hardware. 3. Prominent right neck lymph nodes, which are favored to be reactive. 4. Mucosal thickening in the right maxillary sinus, without evidence of dehiscence of the floor of the maxilla, although there is thinning about the right maxillary canine root.  Patient treated with an IV fluid bolus, Decadron and Unasyn.  The ED provider spoke with ENT, Dr. Jenne Campus who recommended I&D in the ED, medical consult and consulting new oral surgeon, Dr. Metta Clines.  Patient underwent I&D with topical anesthesia in the ED. Hospitalist consulted for admission   Review of Systems: As mentioned  in the history of present illness. All other systems reviewed and are negative.  History reviewed. No pertinent past medical history. Past Surgical History:  Procedure Laterality Date   WISDOM TOOTH EXTRACTION     Social History:  reports that he has been smoking cigarettes. He has been smoking an average of 1 pack per day. He has never used smokeless tobacco. He reports that he does not drink alcohol and does not use drugs.  No Known Allergies  Family History  Problem Relation Age of Onset   Breast cancer Maternal Grandmother     Prior to Admission medications   Medication Sig Start Date End Date Taking? Authorizing Provider  amoxicillin-clavulanate (AUGMENTIN) 875-125 MG tablet Take 1 tablet by mouth every 12 (twelve) hours. 09/29/22   Immordino, Jeannett Senior, FNP  Buprenorphine HCl-Naloxone HCl 8-2 MG FILM Place 0.5 applicators under the tongue every 8 (eight) hours.    [provider]  gentamicin cream (GARAMYCIN) 0.1 % Apply 1 Application topically 2 (two) times daily. 11/09/21   Felecia Shelling, DPM  ondansetron (ZOFRAN-ODT) 4 MG disintegrating tablet Take 1 tablet (4 mg total) by mouth every 8 (eight) hours as needed for nausea or vomiting. 09/29/22   ImmordinoJeannett Senior, FNP    Physical Exam: Vitals:   09/30/22 2358 10/01/22 0335 10/01/22 0430  BP: (!) 140/92 (!) 151/94 (!) 147/86  Pulse: 80 73 (!) 116  Resp: 20 18   Temp: 98.3 F (36.8 C)    TempSrc: Oral    SpO2: 98% 99% 99%  Weight: 65.8 kg    Height: 5\' 8"  (1.727 m)  Physical Exam Vitals and nursing note reviewed.  Constitutional:      General: He is not in acute distress. HENT:     Head: Normocephalic and atraumatic.     Mouth/Throat:     Comments: See picture below Cardiovascular:     Rate and Rhythm: Normal rate and regular rhythm.     Heart sounds: Normal heart sounds.  Pulmonary:     Effort: Pulmonary effort is normal.     Breath sounds: Normal breath sounds.  Abdominal:     Palpations: Abdomen is  soft.     Tenderness: There is no abdominal tenderness.  Neurological:     Mental Status: Mental status is at baseline.     Labs on Admission: I have personally reviewed following labs and imaging studies  CBC: Recent Labs  Lab 10/01/22 0001  WBC 11.1*  NEUTROABS 6.5  HGB 13.3  HCT 40.8  MCV 87.0  PLT 270   Basic Metabolic Panel: Recent Labs  Lab 10/01/22 0001  NA 137  K 3.5  CL 102  CO2 25  GLUCOSE 105*  BUN 9  CREATININE 0.66  CALCIUM 8.9   GFR: Estimated Creatinine Clearance: 126.8 mL/min (by C-G formula based on SCr of 0.66 mg/dL). Liver Function Tests: No results for input(s): "AST", "ALT", "ALKPHOS", "BILITOT", "PROT", "ALBUMIN" in the last 168 hours. No results for input(s): "LIPASE", "AMYLASE" in the last 168 hours. No results for input(s): "AMMONIA" in the last 168 hours. Coagulation Profile: No results for input(s): "INR", "PROTIME" in the last 168 hours. Cardiac Enzymes: No results for input(s): "CKTOTAL", "CKMB", "CKMBINDEX", "TROPONINI" in the last 168 hours. BNP (last 3 results) No results for input(s): "PROBNP" in the last 8760 hours. HbA1C: No results for input(s): "HGBA1C" in the last 72 hours. CBG: No results for input(s): "GLUCAP" in the last 168 hours. Lipid Profile: No results for input(s): "CHOL", "HDL", "LDLCALC", "TRIG", "CHOLHDL", "LDLDIRECT" in the last 72 hours. Thyroid Function Tests: No results for input(s): "TSH", "T4TOTAL", "FREET4", "T3FREE", "THYROIDAB" in the last 72 hours. Anemia Panel: No results for input(s): "VITAMINB12", "FOLATE", "FERRITIN", "TIBC", "IRON", "RETICCTPCT" in the last 72 hours. Urine analysis: No results found for: "COLORURINE", "APPEARANCEUR", "LABSPEC", "PHURINE", "GLUCOSEU", "HGBUR", "BILIRUBINUR", "KETONESUR", "PROTEINUR", "UROBILINOGEN", "NITRITE", "LEUKOCYTESUR"  Radiological Exams on Admission: CT Soft Tissue Neck W Contrast  Result Date: 10/01/2022 CLINICAL DATA:  Possible abscess to the mouth  EXAM: CT NECK WITH CONTRAST TECHNIQUE: Multidetector CT imaging of the neck was performed using the standard protocol following the bolus administration of intravenous contrast. RADIATION DOSE REDUCTION: This exam was performed according to the departmental dose-optimization program which includes automated exposure control, adjustment of the mA and/or kV according to patient size and/or use of iterative reconstruction technique. CONTRAST:  75mL OMNIPAQUE IOHEXOL 300 MG/ML  SOLN COMPARISON:  None Available. FINDINGS: Pharynx and larynx: In the oral cavity, along the medial surface of the maxilla adjacent to periapical lucency about the roots of the right canine, there is a peripherally enhancing collection that measures up to 17 x 13 x 16 mm (AP x TR x CC) (series 3, image 33 and series 6, image 43), most likely an odontogenic abscess. In addition to dehiscence of the medial surface of the maxilla adjacent to this dental root (series 5, image 33), there is also dehiscence of the lateral surface of the maxilla (series 5, image 31). Additional periapical lucency is noted about the root of the right maxillary central incisor (series 5, image 37), with dehiscence of the anterior  aspect of the maxilla but no definite adjacent collection, although evaluation is limited by beam hardening from the patient's dental hardware. No mass or swelling in the pharynx or larynx. Salivary glands: No inflammation, mass, or stone. Thyroid: Normal. Lymph nodes: Prominent right neck lymph nodes, including a right level 1A lymph node measures up to 8 mm and a right level 2B lymph node that measures up to 8 mm. No abnormal density lymph nodes. Vascular: Patent. Limited intracranial: Negative. Visualized orbits: Negative. Mastoids and visualized paranasal sinuses: Mucosal thickening in the right maxillary sinus. No evidence of dehiscence of the floor of the maxilla, although there is thinning about the right maxillary canine root (series 6,  image 38). Otherwise clear paranasal sinuses. Skeleton: No additional acute osseous abnormality. Upper chest: No focal pulmonary opacity or pleural effusion. Other: None. IMPRESSION: 1. Odontogenic abscess extending from the apical periodontitis at the root of the right maxillary canine into the roof of the mouth, measuring up to 17 mm. 2. Additional periapical lucency about the root of the right maxillary central incisor, without definite adjacent collection, although evaluation is limited by beam hardening from the patient's dental hardware. 3. Prominent right neck lymph nodes, which are favored to be reactive. 4. Mucosal thickening in the right maxillary sinus, without evidence of dehiscence of the floor of the maxilla, although there is thinning about the right maxillary canine root. Electronically Signed   By: Wiliam Ke M.D.   On: 10/01/2022 01:30     Data Reviewed: Relevant notes from primary care and specialist visits, past discharge summaries as available in EHR, including Care Everywhere. Prior diagnostic testing as pertinent to current admission diagnoses Updated medications and problem lists for reconciliation ED course, including vitals, labs, imaging, treatment and response to treatment Triage notes, nursing and pharmacy notes and ED provider's notes Notable results as noted in HPI   Assessment and Plan: * Abscess or cellulitis, oral soft tissue Poor dentition with periapical abscesses S/p I&D of abscess in the ED Continue Unasyn and Decadron Continue Suboxone Toradol for pain Oral surgeon, Dr. Metta Clines consulted  Narcotic dependence (HCC) Continue sublingual Suboxone   DVT prophylaxis: Lovenox  Consults: Oral surgeon, Dr. Metta Clines  Advance Care Planning:   Code Status: Full Code   Family Communication: none  Disposition Plan: Back to previous home environment  Severity of Illness: The appropriate patient status for this patient is OBSERVATION. Observation status is  judged to be reasonable and necessary in order to provide the required intensity of service to ensure the patient's safety. The patient's presenting symptoms, physical exam findings, and initial radiographic and laboratory data in the context of their medical condition is felt to place them at decreased risk for further clinical deterioration. Furthermore, it is anticipated that the patient will be medically stable for discharge from the hospital within 2 midnights of admission.   Author: Andris Baumann, MD 10/01/2022 5:03 AM  For on call review [email or web address].

## 2022-10-01 NOTE — Discharge Summary (Signed)
Physician Discharge Summary   Patient: Hunter Kelly MRN: [id number] DOB: 1990/04/18  Admit date:     10/01/2022  Discharge date: 10/01/22  Discharge Physician: Alford Highland   PCP: [facility]   Recommendations at discharge:   Follow-up PCP 5 days Follow-up oral surgery in 1 week  Discharge Diagnoses: Principal Problem:   Abscess or cellulitis, oral soft tissue Active Problems:   Narcotic dependence [facility])   Hospital Course: 32 y.o. male with medical history significant for With poor dentition resulting in narcotic dependence but now on Suboxone who presents to the ED with a painful swelling in the roof of his mouth that appeared several days prior.  He was started on Augmentin but only took 1 dose.  The swelling got progressively more painful prompting the visit to the ED.  He denies fever or chills.  He is able to swallow his secretions. ED course and data review: BP 140/92 with otherwise normal vitals.  Labs with WBC 11,000 and otherwise unremarkable. CT soft tissue neck shows an abscess extending from the right maxillary canine and other findings as follows: IMPRESSION: 1. Odontogenic abscess extending from the apical periodontitis at the root of the right maxillary canine into the roof of the mouth, measuring up to 17 mm. 2. Additional periapical lucency about the root of the right maxillary central incisor, without definite adjacent collection, although evaluation is limited by beam hardening from the patient's dental hardware. 3. Prominent right neck lymph nodes, which are favored to be reactive. 4. Mucosal thickening in the right maxillary sinus, without evidence of dehiscence of the floor of the maxilla, although there is thinning about the right maxillary canine root.   Patient treated with an IV fluid bolus, Decadron and Unasyn.  The ED provider spoke with ENT, Dr. Jenne Campus who recommended I&D in the ED, medical consult and consulting new oral  surgeon, Dr. Metta Clines.  Patient underwent I&D with topical anesthesia in the ED. Hospitalist consulted for admission   7/9.  Case discussed with Dr. Jenne Campus and he texted Dr. Metta Clines.  Patient wanted to follow-up as outpatient, so he was discharged home on Augmentin and prednisone taper.  Assessment and Plan: * Abscess or cellulitis, oral soft tissue Poor dentition with periapical abscesses S/p I&D of abscess in the ED I reached out to ENT who reached out to the oral surgeon.  ENT recommended discharging with follow-up with oral surgeon as outpatient.  The patient wanted to go home.  I will refer to oral surgeon as outpatient.  Will prescribe Augmentin and prednisone taper.  Likely will need quite a few of his teeth removed and potentially abscess drained.  Narcotic dependence (HCC) Continue sublingual Suboxone.  Patient denies any recent drug abuse.           Consultants: Spoke with ENT who reached out to oral surgeon. Procedures performed: ER physician lanced abscess. Disposition: Home Diet recommendation:  Regular diet DISCHARGE MEDICATION: Allergies as of 10/01/2022   No Known Allergies      Medication List     STOP taking these medications    gentamicin cream 0.1 % Commonly known as: GARAMYCIN   ibuprofen 200 MG tablet Commonly known as: ADVIL       TAKE these medications    acetaminophen 500 MG tablet Commonly known as: TYLENOL Take 2 tablets (1,000 mg total) by mouth every 8 (eight) hours as needed for moderate pain or mild pain. What changed:  when to take this reasons to take this  amoxicillin-clavulanate 875-125 MG tablet Commonly known as: AUGMENTIN Take 1 tablet by mouth every 12 (twelve) hours for 13 days.   Buprenorphine HCl-Naloxone HCl 8-2 MG Film Place 1 Film under the tongue 2 (two) times daily.   ondansetron 4 MG disintegrating tablet Commonly known as: ZOFRAN-ODT Take 1 tablet (4 mg total) by mouth every 8 (eight) hours as needed for nausea  or vomiting.   predniSONE 10 MG tablet Commonly known as: DELTASONE 4 tabs po day1;2; 3 tabs po day3,4; 2 tabs po day5,6;  1 tab po day7, 8; 1/2 tab po day9,10        Follow-up Information     Crisp, Hanibal A, DMD Follow up in 1 week(s).   Specialties: Dentistry, Oral Surgery Contact information: [address] Dr Laurell Josephs [address] [place] [address] [phone]         [facility] Follow up in 5 day(s).   Specialty: General Practice Contact information: [address] [place] [address] [phone]                Discharge Exam: [name] Weights   09/30/22 2358  Weight: 65.8 kg   Physical Exam HENT:     Mouth/Throat:     Comments: Poor dentition.  Large swelling upper palate of mouth. Eyes:     General: Lids are normal.     Conjunctiva/sclera: Conjunctivae normal.  Cardiovascular:     Rate and Rhythm: Normal rate and regular rhythm.     Heart sounds: Normal heart sounds, S1 normal and S2 normal.  Pulmonary:     Breath sounds: No decreased breath sounds, wheezing, rhonchi or rales.  Abdominal:     Palpations: Abdomen is soft.     Tenderness: There is no abdominal tenderness.  Musculoskeletal:     Right lower leg: No swelling.     Left lower leg: No swelling.  Skin:    General: Skin is warm.     Findings: No rash.  Neurological:     Mental Status: He is alert and oriented to person, place, and time.      Condition at discharge: stable  The results of significant diagnostics from this hospitalization (including imaging, microbiology, ancillary and laboratory) are listed below for reference.   Imaging Studies: CT Soft Tissue Neck W Contrast  Result Date: 10/01/2022 CLINICAL DATA:  Possible abscess to the mouth EXAM: CT NECK WITH CONTRAST TECHNIQUE: Multidetector CT imaging of the neck was performed using the standard protocol following the bolus administration of intravenous contrast. RADIATION DOSE REDUCTION: This exam was  performed according to the departmental dose-optimization program which includes automated exposure control, adjustment of the mA and/or kV according to patient size and/or use of iterative reconstruction technique. CONTRAST:  75mL OMNIPAQUE IOHEXOL 300 MG/ML  SOLN COMPARISON:  None Available. FINDINGS: Pharynx and larynx: In the oral cavity, along the medial surface of the maxilla adjacent to periapical lucency about the roots of the right canine, there is a peripherally enhancing collection that measures up to 17 x 13 x 16 mm (AP x TR x CC) (series 3, image 33 and series 6, image 43), most likely an odontogenic abscess. In addition to dehiscence of the medial surface of the maxilla adjacent to this dental root (series 5, image 33), there is also dehiscence of the lateral surface of the maxilla (series 5, image 31). Additional periapical lucency is noted about the root of the right maxillary central incisor (series 5, image 37), with dehiscence of the anterior aspect  of the maxilla but no definite adjacent collection, although evaluation is limited by beam hardening from the patient's dental hardware. No mass or swelling in the pharynx or larynx. Salivary glands: No inflammation, mass, or stone. Thyroid: Normal. Lymph nodes: Prominent right neck lymph nodes, including a right level 1A lymph node measures up to 8 mm and a right level 2B lymph node that measures up to 8 mm. No abnormal density lymph nodes. Vascular: Patent. Limited intracranial: Negative. Visualized orbits: Negative. Mastoids and visualized paranasal sinuses: Mucosal thickening in the right maxillary sinus. No evidence of dehiscence of the floor of the maxilla, although there is thinning about the right maxillary canine root (series 6, image 38). Otherwise clear paranasal sinuses. Skeleton: No additional acute osseous abnormality. Upper chest: No focal pulmonary opacity or pleural effusion. Other: None. IMPRESSION: 1. Odontogenic abscess extending  from the apical periodontitis at the root of the right maxillary canine into the roof of the mouth, measuring up to 17 mm. 2. Additional periapical lucency about the root of the right maxillary central incisor, without definite adjacent collection, although evaluation is limited by beam hardening from the patient's dental hardware. 3. Prominent right neck lymph nodes, which are favored to be reactive. 4. Mucosal thickening in the right maxillary sinus, without evidence of dehiscence of the floor of the maxilla, although there is thinning about the right maxillary canine root. Electronically Signed   By: Wiliam Ke M.D.   On: 10/01/2022 01:30    Microbiology: Results for orders placed or performed during the hospital encounter of 10/01/22  Aerobic/Anaerobic Culture w Gram Stain (surgical/deep wound)     Status: None (Preliminary result)   Collection Time: 10/01/22  4:38 AM   Specimen: Abscess  Result Value Ref Range Status   Specimen Description   Final    ABSCESS Performed at [facility], [address] [place], [place] [address]    Special Requests   Final    NONE Performed at [facility], [address] [place], [place] [address]    Gram Stain   Final    NO WBC SEEN NO ORGANISMS SEEN Performed at [facility], [address], [place], [place] [address]    Culture PENDING  Incomplete   Report Status PENDING  Incomplete    Labs: CBC: Recent Labs  Lab 10/01/22 0001  WBC 11.1*  NEUTROABS 6.5  HGB 13.3  HCT 40.8  MCV 87.0  PLT 270   Basic Metabolic Panel: Recent Labs  Lab 10/01/22 0001  NA 137  K 3.5  CL 102  CO2 25  GLUCOSE 105*  BUN 9  CREATININE 0.66  CALCIUM 8.9     Discharge time spent: greater than 30 minutes. Case discussed with ENT  Signed: Alford Highland, MD [facility] 10/01/2022

## 2022-10-04 LAB — AEROBIC/ANAEROBIC CULTURE W GRAM STAIN (SURGICAL/DEEP WOUND): Gram Stain: NONE SEEN

## 2022-10-06 LAB — AEROBIC/ANAEROBIC CULTURE W GRAM STAIN (SURGICAL/DEEP WOUND)

## 2023-04-24 ENCOUNTER — Ambulatory Visit: Payer: Medicaid Other | Admitting: Urology
